# Patient Record
Sex: Male | Born: 1955 | Race: White | Hispanic: No | Marital: Married | Smoking: Never smoker
Health system: Southern US, Community
[De-identification: ages and names within clinical notes are randomized; demographics above are authoritative.]

## PROBLEM LIST (undated history)

## (undated) DIAGNOSIS — D649 Anemia, unspecified: Secondary | ICD-10-CM

## (undated) DIAGNOSIS — E669 Obesity, unspecified: Secondary | ICD-10-CM

## (undated) DIAGNOSIS — M109 Gout, unspecified: Secondary | ICD-10-CM

## (undated) DIAGNOSIS — E119 Type 2 diabetes mellitus without complications: Secondary | ICD-10-CM

## (undated) DIAGNOSIS — I10 Essential (primary) hypertension: Secondary | ICD-10-CM

## (undated) DIAGNOSIS — K760 Fatty (change of) liver, not elsewhere classified: Secondary | ICD-10-CM

## (undated) HISTORY — DX: Anemia, unspecified: D64.9

## (undated) HISTORY — DX: Obesity, unspecified: E66.9

## (undated) HISTORY — DX: Fatty (change of) liver, not elsewhere classified: K76.0

## (undated) HISTORY — DX: Essential (primary) hypertension: I10

---

## 2000-07-04 ENCOUNTER — Ambulatory Visit (HOSPITAL_COMMUNITY): Admission: RE | Admit: 2000-07-04 | Discharge: 2000-07-04 | Payer: Self-pay | Admitting: General Surgery

## 2002-09-10 ENCOUNTER — Inpatient Hospital Stay (HOSPITAL_COMMUNITY): Admission: EM | Admit: 2002-09-10 | Discharge: 2002-09-12 | Payer: Self-pay | Admitting: Emergency Medicine

## 2002-09-16 ENCOUNTER — Encounter (HOSPITAL_COMMUNITY): Admission: RE | Admit: 2002-09-16 | Discharge: 2002-10-16 | Payer: Self-pay | Admitting: Oncology

## 2002-09-16 ENCOUNTER — Encounter: Admission: RE | Admit: 2002-09-16 | Discharge: 2002-09-16 | Payer: Self-pay | Admitting: Oncology

## 2002-09-17 ENCOUNTER — Encounter: Payer: Self-pay | Admitting: Internal Medicine

## 2002-09-17 ENCOUNTER — Ambulatory Visit (HOSPITAL_COMMUNITY): Admission: RE | Admit: 2002-09-17 | Discharge: 2002-09-17 | Payer: Self-pay | Admitting: Internal Medicine

## 2002-10-06 ENCOUNTER — Ambulatory Visit (HOSPITAL_COMMUNITY): Admission: RE | Admit: 2002-10-06 | Discharge: 2002-10-06 | Payer: Self-pay | Admitting: Diagnostic Radiology

## 2002-11-25 ENCOUNTER — Encounter: Admission: RE | Admit: 2002-11-25 | Discharge: 2002-11-25 | Payer: Self-pay | Admitting: Oncology

## 2002-11-25 ENCOUNTER — Encounter (HOSPITAL_COMMUNITY): Admission: RE | Admit: 2002-11-25 | Discharge: 2002-12-24 | Payer: Self-pay | Admitting: Oncology

## 2003-05-25 ENCOUNTER — Encounter (HOSPITAL_COMMUNITY): Admission: RE | Admit: 2003-05-25 | Discharge: 2003-06-24 | Payer: Self-pay | Admitting: Oncology

## 2003-05-25 ENCOUNTER — Encounter: Admission: RE | Admit: 2003-05-25 | Discharge: 2003-05-25 | Payer: Self-pay | Admitting: Oncology

## 2004-01-26 ENCOUNTER — Encounter: Admission: RE | Admit: 2004-01-26 | Discharge: 2004-01-26 | Payer: Self-pay | Admitting: Oncology

## 2004-01-26 ENCOUNTER — Ambulatory Visit (HOSPITAL_COMMUNITY): Payer: Self-pay | Admitting: Oncology

## 2005-01-24 ENCOUNTER — Encounter: Admission: RE | Admit: 2005-01-24 | Discharge: 2005-01-24 | Payer: Self-pay | Admitting: Oncology

## 2005-01-26 ENCOUNTER — Ambulatory Visit (HOSPITAL_COMMUNITY): Payer: Self-pay | Admitting: Oncology

## 2005-03-23 ENCOUNTER — Ambulatory Visit (HOSPITAL_COMMUNITY): Admission: RE | Admit: 2005-03-23 | Discharge: 2005-03-23 | Payer: Self-pay | Admitting: Family Medicine

## 2008-03-11 ENCOUNTER — Inpatient Hospital Stay (HOSPITAL_COMMUNITY): Admission: EM | Admit: 2008-03-11 | Discharge: 2008-03-12 | Payer: Self-pay | Admitting: Emergency Medicine

## 2008-03-12 ENCOUNTER — Ambulatory Visit: Payer: Self-pay | Admitting: Internal Medicine

## 2008-03-16 ENCOUNTER — Ambulatory Visit (HOSPITAL_COMMUNITY): Admission: RE | Admit: 2008-03-16 | Discharge: 2008-03-16 | Payer: Self-pay | Admitting: Internal Medicine

## 2008-03-24 ENCOUNTER — Ambulatory Visit: Payer: Self-pay | Admitting: Internal Medicine

## 2008-03-24 ENCOUNTER — Encounter: Payer: Self-pay | Admitting: Internal Medicine

## 2008-03-24 ENCOUNTER — Ambulatory Visit (HOSPITAL_COMMUNITY): Admission: RE | Admit: 2008-03-24 | Discharge: 2008-03-24 | Payer: Self-pay | Admitting: Internal Medicine

## 2008-04-08 ENCOUNTER — Ambulatory Visit: Payer: Self-pay | Admitting: Internal Medicine

## 2008-04-08 ENCOUNTER — Encounter: Payer: Self-pay | Admitting: Urgent Care

## 2008-04-08 LAB — CONVERTED CEMR LAB
Basophils Absolute: 0.1 10*3/uL (ref 0.0–0.1)
Basophils Relative: 1 % (ref 0–1)
Eosinophils Absolute: 0.1 10*3/uL (ref 0.0–0.7)
Eosinophils Relative: 1 % (ref 0–5)
HCT: 37.9 % — ABNORMAL LOW (ref 39.0–52.0)
Hemoglobin: 10.4 g/dL — ABNORMAL LOW (ref 13.0–17.0)
Lymphocytes Relative: 10 % — ABNORMAL LOW (ref 12–46)
Lymphs Abs: 0.9 10*3/uL (ref 0.7–4.0)
MCHC: 27.4 g/dL — ABNORMAL LOW (ref 30.0–36.0)
MCV: 73.4 fL — ABNORMAL LOW (ref 78.0–100.0)
Monocytes Absolute: 0.5 10*3/uL (ref 0.1–1.0)
Monocytes Relative: 6 % (ref 3–12)
Neutro Abs: 7.2 10*3/uL (ref 1.7–7.7)
Neutrophils Relative %: 82 % — ABNORMAL HIGH (ref 43–77)
Platelets: 419 10*3/uL — ABNORMAL HIGH (ref 150–400)
RBC: 5.16 M/uL (ref 4.22–5.81)
RDW: 25.2 % — ABNORMAL HIGH (ref 11.5–15.5)
WBC: 8.7 10*3/uL (ref 4.0–10.5)

## 2008-04-26 ENCOUNTER — Ambulatory Visit: Payer: Self-pay | Admitting: Internal Medicine

## 2008-05-07 ENCOUNTER — Encounter: Payer: Self-pay | Admitting: Urgent Care

## 2008-05-13 LAB — CONVERTED CEMR LAB
Basophils Absolute: 0.1 10*3/uL (ref 0.0–0.1)
Basophils Relative: 2 % — ABNORMAL HIGH (ref 0–1)
Eosinophils Absolute: 0.1 10*3/uL (ref 0.0–0.7)
Eosinophils Relative: 2 % (ref 0–5)
HCT: 36.9 % — ABNORMAL LOW (ref 39.0–52.0)
Hemoglobin: 10.6 g/dL — ABNORMAL LOW (ref 13.0–17.0)
Lymphocytes Relative: 17 % (ref 12–46)
Lymphs Abs: 1.3 10*3/uL (ref 0.7–4.0)
MCHC: 28.7 g/dL — ABNORMAL LOW (ref 30.0–36.0)
MCV: 73.7 fL — ABNORMAL LOW (ref 78.0–100.0)
Monocytes Absolute: 0.6 10*3/uL (ref 0.1–1.0)
Monocytes Relative: 9 % (ref 3–12)
Neutro Abs: 5.2 10*3/uL (ref 1.7–7.7)
Neutrophils Relative %: 71 % (ref 43–77)
Platelets: 341 10*3/uL (ref 150–400)
RBC: 5.01 M/uL (ref 4.22–5.81)
RDW: 20 % — ABNORMAL HIGH (ref 11.5–15.5)
WBC: 7.3 10*3/uL (ref 4.0–10.5)

## 2008-05-17 ENCOUNTER — Encounter: Payer: Self-pay | Admitting: Urgent Care

## 2008-05-20 ENCOUNTER — Ambulatory Visit (HOSPITAL_COMMUNITY): Admission: RE | Admit: 2008-05-20 | Discharge: 2008-05-20 | Payer: Self-pay | Admitting: Internal Medicine

## 2008-06-01 ENCOUNTER — Telehealth (INDEPENDENT_AMBULATORY_CARE_PROVIDER_SITE_OTHER): Payer: Self-pay

## 2008-06-02 ENCOUNTER — Encounter: Payer: Self-pay | Admitting: Internal Medicine

## 2008-06-04 ENCOUNTER — Ambulatory Visit: Payer: Self-pay | Admitting: Internal Medicine

## 2008-06-04 ENCOUNTER — Telehealth: Payer: Self-pay | Admitting: Urgent Care

## 2008-06-21 ENCOUNTER — Encounter (HOSPITAL_COMMUNITY): Admission: RE | Admit: 2008-06-21 | Discharge: 2008-07-22 | Payer: Self-pay | Admitting: Oncology

## 2008-06-21 ENCOUNTER — Ambulatory Visit (HOSPITAL_COMMUNITY): Payer: Self-pay | Admitting: Oncology

## 2008-07-07 ENCOUNTER — Encounter: Payer: Self-pay | Admitting: Internal Medicine

## 2008-08-24 ENCOUNTER — Ambulatory Visit (HOSPITAL_COMMUNITY): Payer: Self-pay | Admitting: Oncology

## 2008-08-24 ENCOUNTER — Encounter (HOSPITAL_COMMUNITY): Admission: RE | Admit: 2008-08-24 | Discharge: 2008-09-23 | Payer: Self-pay | Admitting: Oncology

## 2008-08-31 ENCOUNTER — Ambulatory Visit (HOSPITAL_COMMUNITY): Admission: RE | Admit: 2008-08-31 | Discharge: 2008-08-31 | Payer: Self-pay | Admitting: Family Medicine

## 2008-11-22 ENCOUNTER — Ambulatory Visit (HOSPITAL_COMMUNITY): Payer: Self-pay | Admitting: Oncology

## 2008-11-22 ENCOUNTER — Encounter (HOSPITAL_COMMUNITY): Admission: RE | Admit: 2008-11-22 | Discharge: 2008-12-22 | Payer: Self-pay | Admitting: Oncology

## 2008-12-15 ENCOUNTER — Encounter: Payer: Self-pay | Admitting: Internal Medicine

## 2009-02-15 ENCOUNTER — Ambulatory Visit (HOSPITAL_COMMUNITY): Payer: Self-pay | Admitting: Oncology

## 2009-02-15 ENCOUNTER — Encounter (HOSPITAL_COMMUNITY): Admission: RE | Admit: 2009-02-15 | Discharge: 2009-03-17 | Payer: Self-pay | Admitting: Oncology

## 2009-04-07 ENCOUNTER — Encounter: Payer: Self-pay | Admitting: Internal Medicine

## 2009-05-13 ENCOUNTER — Ambulatory Visit (HOSPITAL_COMMUNITY): Payer: Self-pay | Admitting: Oncology

## 2009-05-13 ENCOUNTER — Encounter (HOSPITAL_COMMUNITY): Admission: RE | Admit: 2009-05-13 | Discharge: 2009-06-12 | Payer: Self-pay | Admitting: Oncology

## 2009-06-02 ENCOUNTER — Encounter: Payer: Self-pay | Admitting: Internal Medicine

## 2009-12-13 ENCOUNTER — Ambulatory Visit (HOSPITAL_COMMUNITY): Payer: Self-pay | Admitting: Oncology

## 2009-12-13 ENCOUNTER — Encounter (HOSPITAL_COMMUNITY): Admission: RE | Admit: 2009-12-13 | Discharge: 2009-12-23 | Payer: Self-pay | Admitting: Oncology

## 2009-12-27 ENCOUNTER — Encounter: Payer: Self-pay | Admitting: Internal Medicine

## 2010-01-09 ENCOUNTER — Encounter (HOSPITAL_COMMUNITY)
Admission: RE | Admit: 2010-01-09 | Discharge: 2010-02-08 | Payer: Self-pay | Source: Home / Self Care | Admitting: Oncology

## 2010-01-12 DIAGNOSIS — K2970 Gastritis, unspecified, without bleeding: Secondary | ICD-10-CM | POA: Insufficient documentation

## 2010-01-12 DIAGNOSIS — D509 Iron deficiency anemia, unspecified: Secondary | ICD-10-CM | POA: Insufficient documentation

## 2010-01-12 DIAGNOSIS — Z8719 Personal history of other diseases of the digestive system: Secondary | ICD-10-CM | POA: Insufficient documentation

## 2010-01-12 DIAGNOSIS — D126 Benign neoplasm of colon, unspecified: Secondary | ICD-10-CM | POA: Insufficient documentation

## 2010-01-12 DIAGNOSIS — K8020 Calculus of gallbladder without cholecystitis without obstruction: Secondary | ICD-10-CM | POA: Insufficient documentation

## 2010-01-12 DIAGNOSIS — K700 Alcoholic fatty liver: Secondary | ICD-10-CM | POA: Insufficient documentation

## 2010-01-16 ENCOUNTER — Encounter: Payer: Self-pay | Admitting: Gastroenterology

## 2010-01-16 ENCOUNTER — Ambulatory Visit: Payer: Self-pay | Admitting: Internal Medicine

## 2010-01-16 DIAGNOSIS — R7401 Elevation of levels of liver transaminase levels: Secondary | ICD-10-CM | POA: Insufficient documentation

## 2010-01-16 DIAGNOSIS — R740 Nonspecific elevation of levels of transaminase and lactic acid dehydrogenase [LDH]: Secondary | ICD-10-CM

## 2010-01-16 DIAGNOSIS — K7689 Other specified diseases of liver: Secondary | ICD-10-CM | POA: Insufficient documentation

## 2010-01-17 ENCOUNTER — Ambulatory Visit: Payer: Self-pay | Admitting: Internal Medicine

## 2010-01-19 ENCOUNTER — Encounter: Payer: Self-pay | Admitting: Internal Medicine

## 2010-01-20 ENCOUNTER — Encounter: Payer: Self-pay | Admitting: Internal Medicine

## 2010-01-20 ENCOUNTER — Ambulatory Visit (HOSPITAL_COMMUNITY): Admission: RE | Admit: 2010-01-20 | Discharge: 2010-01-20 | Payer: Self-pay | Admitting: Internal Medicine

## 2010-01-20 LAB — CONVERTED CEMR LAB
A-1 Antitrypsin, Ser: 136 mg/dL (ref 83–200)
Ceruloplasmin: 32 mg/dL (ref 21–63)
HCV Ab: NEGATIVE
Hepatitis B Surface Ag: NEGATIVE

## 2010-01-23 ENCOUNTER — Ambulatory Visit (HOSPITAL_COMMUNITY): Admission: RE | Admit: 2010-01-23 | Discharge: 2010-01-23 | Payer: Self-pay | Admitting: Internal Medicine

## 2010-01-27 ENCOUNTER — Telehealth: Payer: Self-pay | Admitting: Gastroenterology

## 2010-02-02 ENCOUNTER — Encounter: Payer: Self-pay | Admitting: Internal Medicine

## 2010-02-20 ENCOUNTER — Encounter: Payer: Self-pay | Admitting: Gastroenterology

## 2010-03-03 LAB — CONVERTED CEMR LAB
ALT: 76 units/L — ABNORMAL HIGH (ref 0–53)
AST: 62 units/L — ABNORMAL HIGH (ref 0–37)
Albumin: 4.2 g/dL (ref 3.5–5.2)
Alkaline Phosphatase: 47 units/L (ref 39–117)
Bilirubin, Direct: 0.1 mg/dL (ref 0.0–0.3)
Ferritin: 117 ng/mL (ref 22–322)
Indirect Bilirubin: 0.5 mg/dL (ref 0.0–0.9)
Total Bilirubin: 0.6 mg/dL (ref 0.3–1.2)
Total Protein: 7 g/dL (ref 6.0–8.3)

## 2010-03-06 ENCOUNTER — Encounter (INDEPENDENT_AMBULATORY_CARE_PROVIDER_SITE_OTHER): Payer: Self-pay

## 2010-03-13 ENCOUNTER — Encounter (HOSPITAL_COMMUNITY)
Admission: RE | Admit: 2010-03-13 | Discharge: 2010-04-12 | Payer: Self-pay | Source: Home / Self Care | Attending: Oncology | Admitting: Oncology

## 2010-03-13 ENCOUNTER — Emergency Department (HOSPITAL_COMMUNITY)
Admission: EM | Admit: 2010-03-13 | Discharge: 2010-03-13 | Payer: Self-pay | Source: Home / Self Care | Admitting: Emergency Medicine

## 2010-03-13 ENCOUNTER — Ambulatory Visit (HOSPITAL_COMMUNITY): Payer: Self-pay | Admitting: Oncology

## 2010-04-24 ENCOUNTER — Telehealth: Payer: Self-pay | Admitting: Urgent Care

## 2010-04-24 LAB — CONVERTED CEMR LAB
ALT: 57 units/L — ABNORMAL HIGH (ref 0–53)
AST: 41 units/L — ABNORMAL HIGH (ref 0–37)
Albumin: 4.2 g/dL (ref 3.5–5.2)
Alkaline Phosphatase: 49 units/L (ref 39–117)
Bilirubin, Direct: 0.1 mg/dL (ref 0.0–0.3)
Indirect Bilirubin: 0.3 mg/dL (ref 0.0–0.9)
Total Bilirubin: 0.4 mg/dL (ref 0.3–1.2)
Total Protein: 6.8 g/dL (ref 6.0–8.3)

## 2010-04-27 NOTE — Letter (Signed)
Summary: NUTRITIONAL CONSULT  NUTRITIONAL CONSULT   Imported By: Ave Filter 01/16/2010 10:50:14  _____________________________________________________________________  External Attachment:    Type:   Image     Comment:   External Document

## 2010-04-27 NOTE — Assessment & Plan Note (Signed)
Summary: abd pain/GB/wt loss/liver enzymes elevated/ss   Visit Type:  Initial Consult Referring Provider:  Dr. Dareen Piano Primary Care Provider:  Phillips Odor  Chief Complaint:  abnormal liver function tests.  History of Present Illness: [name] is a pleasant 55 y/o WM, who presents at request of Dr. Dareen Piano, for further evaluation of abnormal LFTs. See labs below.   C/O small twinges in ruq with BM but that has went away since BMs more regular. Weight down 20 pounds, intentionally. Tolerating oral iron for IDA. Never needed iron infusions. Followed by Dr. Mariel Sleet. Admits to stopping iron recently and ferritin dropped from 104 to 42 in one month.  Also quit allopurinol due to abnormal lfts. He started Milk Thistle on his own. Good appetite. Hungry all the time. Having more trouble dropping extra weight due to increased appetite since resuming iron.  No melena, brbpr. No heartburn, dysphagia, n/v.   Labs 01/06/10: WBC 6100, H/H 15.2/45.1, Plt 149,000, Cre 0.86, Tbili 0.5, AP 45, AST 68, ALT 106, alb 4, ferritin 42.  Labs 12/13/09: Plt 181,000, AST 142, ALT 188, ferritin 104.   Labs 11/25/09: AST 123, ALT 219.  Current Medications (verified): 1)  Ferrous Sulfate 325 (65 Fe) Mg Tabs (Ferrous Sulfate) .... One Tablet Every Other Day 2)  Milk Thistle .... Once Daily 3)  Cinnamon .... One Daily  Allergies (verified): No Known Drug Allergies  Past History:  Past Medical History: H/O profound IDA in 2004, Hgb 5.  He had EGD with moderate sized hh, two leiomyomas. TCS, 5mm benign colon polyp. SB givens by Dr. 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  In 2009, presented with recurrent IDA, Hgb 5, w/u included EGD/TCS. He had nodular antral folds with erosions, bx showed h.pylori (since been treated successfully given neg H.Pylori stool Ag), sb bx negative for celiac (serologies neg too), tubular adenoma removed from colon (next TCS 02/2013). Givens sb capsule  2/10-->gastric erosions, normal small bowel Gout Legally blind right eye  Past Surgical History: BENIGN CYST REMOVED FROM HIS BACK    Family History: Brother and mother both have had pancreatitis, but no h/o alcohol use. Mother, deceased at age 69, DM, CAD. No FH of CRC.   Social History: Married. Radio broadcast assistant. Denies tob use, but chews tobacco. Prior h/o heavy etoh use but none in years. One beer per month. None since 9/11.   Review of Systems General:  Denies fever, chills, sweats, anorexia, fatigue, and weakness. Eyes:  Denies vision loss. ENT:  Denies nasal congestion, sore throat, hoarseness, and difficulty swallowing. CV:  Denies chest pains, angina, palpitations, dyspnea on exertion, and peripheral edema. Resp:  Denies dyspnea at rest, dyspnea with exercise, cough, sputum, and wheezing. GI:  See HPI. GU:  Denies urinary burning and blood in urine. MS:  Denies joint pain / LOM. Derm:  Denies rash and itching. Neuro:  Denies weakness, frequent headaches, memory loss, and confusion. Psych:  Denies depression and anxiety. Endo:  Denies unusual weight change. Heme:  Denies bruising and bleeding. Allergy:  Denies hives and rash.  Vital Signs:  Patient profile:   55 year old male Height:      72 inches Weight:      243 pounds BMI:     33.08 Temp:     97.6 degrees F oral Pulse rate:   72 / minute BP sitting:   152 / 90  (left arm) Cuff size:   large  Vitals Entered By: Cloria Spring  LPN (January 16, 2010 9:34 AM)  Physical Exam  General:  Well developed, well nourished, no acute distress. Head:  Normocephalic and atraumatic. Eyes:  sclera nonicteric Mouth:  Oropharyngeal mucosa moist, pink.  No lesions, erythema or exudate.    Neck:  Supple; no masses or thyromegaly. Lungs:  Clear throughout to auscultation. Heart:  Regular rate and rhythm; no murmurs, rubs,  or bruits. Abdomen:  Bowel sounds normal.  Abdomen is soft, nontender, nondistended.  No rebound or  guarding.  No hepatosplenomegaly, masses or hernias.  No abdominal bruits.  Extremities:  No clubbing, cyanosis, edema or deformities noted. Neurologic:  Alert and  oriented x4;  grossly normal neurologically. Skin:  Intact without significant lesions or rashes. Cervical Nodes:  No significant cervical adenopathy. Psych:  Alert and cooperative. Normal mood and affect.  Impression & Recommendations:  Problem # 1:  TRANSAMINASES, SERUM, ELEVATED (ICD-790.4) Patient gives h/o intermittent elevated of AST/ALT in the past. Recently had significant increase. Remote heavy alcohol use but no significant use in years. No alcohol since 9/11. With 20 pound weight loss, he has had decrease in transaminases. H/O fatty liver on prior abd u/s in 2009 as well as cholelithiasis. Suspect abnormal lfts related to fatty liver but will recheck abd u/s and check viral markers. Repeat LFTs in four more weeks. Recommend continued slow weight loss with goal of 10 more pounds in the next 2 months. Nutrition referral for weight loss, gout, hyperglycemia. Fatty liver information provided to patient. Orders: T-Hepatitis C Antibody ([id number]) T-Hepatitis B Surface Antigen [phone]) T-Ferritin [phone]) T-Hepatic Function [phone]) T-Ceruloplasmin [phone]) T-Alpha-1-Antitrypsin Tot ([id number])  Problem # 2:  ANEMIA, IRON DEFICIENCY, CHRONIC (ICD-280.9) Recent drop in ferritin when patient came off oral iron. Complete work-up in 2004 and 2009 as outlined above. Advised to continue chronic iron therapy.  As long as he maintains his ferritin, no w/u needed at this time. He will have recheck in four weeks. Otherwise next TCS due in 02/2013.  Orders: T-Ferritin ([id number]) I would like to thank Dr. Azzie Roup for allowing [place] to take part in the care of this nice patient.  Appended Document: Orders Update    Clinical Lists Changes  Orders: Added new Service order of Consultation Level IV  ([id number]) - Signed      Appended Document: abd pain/GB/wt loss/liver enzymes elevated/ss GB u/s - reviewed w Dr. Tyron Russell; cholelithiasi - GB wall 3 mm ULN. CBD dilated; hx of elevated lfts;  some ruq described at recent ov w inc lfts; pt may need gbout; needs imaging of biliary tree further via MRCP- please schedule  Appended Document: abd pain/GB/wt loss/liver enzymes elevated/ss tried to call pt- NA  Appended Document: abd pain/GB/wt loss/liver enzymes elevated/ss Pt scheduled for MRCP 01/23/10@8 :00a.m.  Appended Document: abd pain/GB/wt loss/liver enzymes elevated/ss pt aware

## 2010-04-27 NOTE — Letter (Signed)
Summary: CLINICAL OUTPT DIETITIAN  CLINICAL OUTPT DIETITIAN   Imported By: Rexene Alberts 02/02/2010 09:32:18  _____________________________________________________________________  External Attachment:    Type:   Image     Comment:   External Document

## 2010-04-27 NOTE — Letter (Signed)
Summary: LABS FROM [facility]  LABS FROM [facility]   Imported By: Rexene Alberts 12/27/2009 13:53:23  _____________________________________________________________________  External Attachment:    Type:   Image     Comment:   External Document

## 2010-04-27 NOTE — Letter (Signed)
Summary: LABS  LABS   Imported By: Rexene Alberts 01/17/2010 09:12:44  _____________________________________________________________________  External Attachment:    Type:   Image     Comment:   External Document

## 2010-04-27 NOTE — Letter (Signed)
Summary: ABD U/S ORDER  ABD U/S ORDER   Imported By: Ave Filter 01/16/2010 11:00:52  _____________________________________________________________________  External Attachment:    Type:   Image     Comment:   External Document

## 2010-04-27 NOTE — Assessment & Plan Note (Signed)
Summary: DROPPED OFF STOOL/SS    Pt returned one ifobt and it was negative.     Allergies: No Known Drug Allergies  Other Orders: Immuno-chemical Fecal Occult ([id number])  Appended Document: DROPPED OFF STOOL/SS see labs addendum

## 2010-04-27 NOTE — Progress Notes (Signed)
Summary: f/u gallstones/mrcp  ---- Converted from flag ---- ---- 01/25/2010 10:47 AM, Jonathon Bellows MD, Caleen Essex wrote: sounds reasonable to recheck lfts and f/u w pt.  ---- 01/25/2010 8:19 AM, Leanna Battles. Lewis PA-C wrote: I really did not get a big impression of abd pain on this one. ?LFTs more related to fatty liver rather than obstruction? What do you think about repeat LFTS in 4 weeks and if improved and pt assymptomatic, then just watch OTHERWISE could send for possible cholecystectomy and liver bx.  ---- 01/23/2010 9:56 AM, Jonathon Bellows MD, Caleen Essex wrote: do you think he needs gb out? ------------------------------  Please let pt know. MRCP showed gallstones but gallbladder without evidence of acute inflammation. Discussed with Dr. Jena Gauss. Recommend the following:  1. Monitor for RUQ pain associated with meals. If develops this type of symptoms, would consider taking out gallbladder. If ever has gb surgery, ask surgeon for liver bx at same time. 2. Recheck LFTs as planned.  3. F/U OV in 6-8 weeks.  4. F/U with PCP regarding  "8 mm diameter nonshadowing echogenic focus at upper pole, question small angiomyolipoma, or less likely nonshadowing calculus" seen on recent abd u/s. Please send copy of u/s to PCP.   Appended Document: f/u gallstones/mrcp pt aware

## 2010-04-27 NOTE — Letter (Signed)
Summary: MRCP ORDER  MRCP ORDER   Imported By: Ave Filter 01/20/2010 10:53:50  _____________________________________________________________________  External Attachment:    Type:   Image     Comment:   External Document

## 2010-04-27 NOTE — Letter (Signed)
Summary: [facility]   Imported By: Diana Eves 04/07/2009 10:11:27  _____________________________________________________________________  External Attachment:    Type:   Image     Comment:   External Document

## 2010-04-27 NOTE — Miscellaneous (Signed)
Summary: Orders Update  Clinical Lists Changes  Orders: Added new Test order of T-Hepatic Function ([id number]) - Signed 

## 2010-04-27 NOTE — Letter (Signed)
Summary: [facility]   Imported By: Diana Eves 06/02/2009 14:52:06  _____________________________________________________________________  External Attachment:    Type:   Image     Comment:   External Document

## 2010-04-27 NOTE — Letter (Signed)
Summary: [facility]  [facility]   Imported By: Rexene Alberts 01/19/2010 12:02:14  _____________________________________________________________________  External Attachment:    Type:   Image     Comment:   External Document

## 2010-04-27 NOTE — Letter (Signed)
Summary: Recall, Labs Needed  [facility]  [address]   [place], [place] [address]   Phone: [phone]  Fax: [phone]    March 06, 2010  Calvin Green [address] [place], [place]  [address]  [place] [birth year]   Dear [name],   Our records indicate it is time to repeat your blood work.  You can take the enclosed form to the lab on or near the date indicated.  Please make note of the new location of the lab:   [address], [address]nd floor   [facility]  Our office will call you within a week to ten business days with the results.  If you do not hear from [place] in 10 business days, you should call the office.  If you have any questions regarding this, call the office at [phone], and ask for the nurse.  Labs are due on 04/17/2010.   Sincerely,    Hendricks Limes LPN  [facility] Ph: [phone]   Fax: [phone]

## 2010-05-03 NOTE — Progress Notes (Signed)
Summary: Pt prefers to see DR Jena Gauss ONLY  ---- Converted from flag ---- ---- 04/24/2010 10:55 AM, Joselyn Arrow FNP-BC wrote:   ---- 04/21/2010 5:21 PM, Jonathon Bellows MD, Caleen Essex wrote: He has 3 options; 1.  see extender and pay what everyboduy else pays;  2.  Wait  to see me April / May or whenever;   3.   or go somewhere else.  If he wants to speak to someone further up the line, I'd give him Scott jobe's contact info ------------------------------

## 2010-06-05 LAB — CBC
HCT: 47.2 % (ref 39.0–52.0)
Hemoglobin: 16.3 g/dL (ref 13.0–17.0)
MCH: 28.5 pg (ref 26.0–34.0)
MCHC: 34.5 g/dL (ref 30.0–36.0)
MCV: 82.7 fL (ref 78.0–100.0)
Platelets: 178 10*3/uL (ref 150–400)
RBC: 5.71 MIL/uL (ref 4.22–5.81)
RDW: 13.6 % (ref 11.5–15.5)
WBC: 6.9 10*3/uL (ref 4.0–10.5)

## 2010-06-05 LAB — COMPREHENSIVE METABOLIC PANEL
ALT: 57 U/L — ABNORMAL HIGH (ref 0–53)
AST: 43 U/L — ABNORMAL HIGH (ref 0–37)
Albumin: 3.9 g/dL (ref 3.5–5.2)
Alkaline Phosphatase: 42 U/L (ref 39–117)
BUN: 12 mg/dL (ref 6–23)
CO2: 29 mEq/L (ref 19–32)
Calcium: 9.2 mg/dL (ref 8.4–10.5)
Chloride: 101 mEq/L (ref 96–112)
Creatinine, Ser: 1 mg/dL (ref 0.4–1.5)
GFR calc Af Amer: >60 mL/min (ref >60)
GFR calc non Af Amer: >60 mL/min (ref >60)
Glucose, Bld: 117 mg/dL — ABNORMAL HIGH (ref 70–99)
Potassium: 4.5 mEq/L (ref 3.5–5.1)
Sodium: 138 mEq/L (ref 135–145)
Total Bilirubin: 0.6 mg/dL (ref 0.3–1.2)
Total Protein: 6.4 g/dL (ref 6.0–8.3)

## 2010-06-05 LAB — DIFFERENTIAL
Basophils Absolute: 0.1 10*3/uL (ref 0.0–0.1)
Basophils Relative: 1 % (ref 0–1)
Eosinophils Absolute: 0.2 10*3/uL (ref 0.0–0.7)
Eosinophils Relative: 2 % (ref 0–5)
Lymphocytes Relative: 16 % (ref 12–46)
Lymphs Abs: 1.1 10*3/uL (ref 0.7–4.0)
Monocytes Absolute: 0.6 10*3/uL (ref 0.1–1.0)
Monocytes Relative: 9 % (ref 3–12)
Neutro Abs: 5 10*3/uL (ref 1.7–7.7)
Neutrophils Relative %: 73 % (ref 43–77)

## 2010-06-05 LAB — FERRITIN: Ferritin: 70 ng/mL (ref 22–322)

## 2010-06-07 LAB — COMPREHENSIVE METABOLIC PANEL
ALT: 106 U/L — ABNORMAL HIGH (ref 0–53)
AST: 68 U/L — ABNORMAL HIGH (ref 0–37)
Albumin: 4 g/dL (ref 3.5–5.2)
Alkaline Phosphatase: 45 U/L (ref 39–117)
BUN: 10 mg/dL (ref 6–23)
CO2: 27 mEq/L (ref 19–32)
Calcium: 9.5 mg/dL (ref 8.4–10.5)
Chloride: 105 mEq/L (ref 96–112)
Creatinine, Ser: 0.86 mg/dL (ref 0.4–1.5)
GFR calc Af Amer: >60 mL/min (ref >60)
GFR calc non Af Amer: >60 mL/min (ref >60)
Glucose, Bld: 131 mg/dL — ABNORMAL HIGH (ref 70–99)
Potassium: 4.3 mEq/L (ref 3.5–5.1)
Sodium: 139 mEq/L (ref 135–145)
Total Bilirubin: 0.5 mg/dL (ref 0.3–1.2)
Total Protein: 6.4 g/dL (ref 6.0–8.3)

## 2010-06-07 LAB — CBC
HCT: 45.1 % (ref 39.0–52.0)
Hemoglobin: 15.2 g/dL (ref 13.0–17.0)
MCH: 28.2 pg (ref 26.0–34.0)
MCHC: 33.8 g/dL (ref 30.0–36.0)
MCV: 83.4 fL (ref 78.0–100.0)
Platelets: 149 10*3/uL — ABNORMAL LOW (ref 150–400)
RBC: 5.41 MIL/uL (ref 4.22–5.81)
RDW: 14.6 % (ref 11.5–15.5)
WBC: 6.1 10*3/uL (ref 4.0–10.5)

## 2010-06-07 LAB — DIFFERENTIAL
Basophils Absolute: 0.1 10*3/uL (ref 0.0–0.1)
Basophils Relative: 1 % (ref 0–1)
Eosinophils Absolute: 0.1 10*3/uL (ref 0.0–0.7)
Eosinophils Relative: 2 % (ref 0–5)
Lymphocytes Relative: 18 % (ref 12–46)
Lymphs Abs: 1.1 10*3/uL (ref 0.7–4.0)
Monocytes Absolute: 0.4 10*3/uL (ref 0.1–1.0)
Monocytes Relative: 7 % (ref 3–12)
Neutro Abs: 4.4 10*3/uL (ref 1.7–7.7)
Neutrophils Relative %: 72 % (ref 43–77)

## 2010-06-07 LAB — FERRITIN: Ferritin: 42 ng/mL (ref 22–322)

## 2010-06-08 LAB — DIFFERENTIAL
Basophils Absolute: 0 10*3/uL (ref 0.0–0.1)
Basophils Relative: 1 % (ref 0–1)
Eosinophils Absolute: 0.1 10*3/uL (ref 0.0–0.7)
Eosinophils Relative: 2 % (ref 0–5)
Lymphocytes Relative: 18 % (ref 12–46)
Lymphs Abs: 0.8 10*3/uL (ref 0.7–4.0)
Monocytes Absolute: 0.3 10*3/uL (ref 0.1–1.0)
Monocytes Relative: 7 % (ref 3–12)
Neutro Abs: 3.2 10*3/uL (ref 1.7–7.7)
Neutrophils Relative %: 72 % (ref 43–77)

## 2010-06-08 LAB — CBC
HCT: 48.3 % (ref 39.0–52.0)
Hemoglobin: 15.9 g/dL (ref 13.0–17.0)
MCH: 27.7 pg (ref 26.0–34.0)
MCHC: 33 g/dL (ref 30.0–36.0)
MCV: 84 fL (ref 78.0–100.0)
Platelets: 181 10*3/uL (ref 150–400)
RBC: 5.75 MIL/uL (ref 4.22–5.81)
RDW: 15.3 % (ref 11.5–15.5)
WBC: 4.5 10*3/uL (ref 4.0–10.5)

## 2010-06-08 LAB — COMPREHENSIVE METABOLIC PANEL
ALT: 188 U/L — ABNORMAL HIGH (ref 0–53)
AST: 142 U/L — ABNORMAL HIGH (ref 0–37)
Albumin: 4.2 g/dL (ref 3.5–5.2)
Alkaline Phosphatase: 47 U/L (ref 39–117)
BUN: 11 mg/dL (ref 6–23)
CO2: 25 mEq/L (ref 19–32)
Calcium: 9.5 mg/dL (ref 8.4–10.5)
Chloride: 105 mEq/L (ref 96–112)
Creatinine, Ser: 1.04 mg/dL (ref 0.4–1.5)
GFR calc Af Amer: >60 mL/min (ref >60)
GFR calc non Af Amer: >60 mL/min (ref >60)
Glucose, Bld: 138 mg/dL — ABNORMAL HIGH (ref 70–99)
Potassium: 4.4 mEq/L (ref 3.5–5.1)
Sodium: 139 mEq/L (ref 135–145)
Total Bilirubin: 0.9 mg/dL (ref 0.3–1.2)
Total Protein: 7 g/dL (ref 6.0–8.3)

## 2010-06-08 LAB — FERRITIN: Ferritin: 104 ng/mL (ref 22–322)

## 2010-06-14 LAB — DIFFERENTIAL
Basophils Absolute: 0 10*3/uL (ref 0.0–0.1)
Basophils Relative: 1 % (ref 0–1)
Eosinophils Absolute: 0.1 10*3/uL (ref 0.0–0.7)
Eosinophils Relative: 1 % (ref 0–5)
Lymphocytes Relative: 15 % (ref 12–46)
Lymphs Abs: 1.1 10*3/uL (ref 0.7–4.0)
Monocytes Absolute: 0.4 10*3/uL (ref 0.1–1.0)
Monocytes Relative: 6 % (ref 3–12)
Neutro Abs: 6 10*3/uL (ref 1.7–7.7)
Neutrophils Relative %: 78 % — ABNORMAL HIGH (ref 43–77)

## 2010-06-14 LAB — CBC
HCT: 45.1 % (ref 39.0–52.0)
Hemoglobin: 15.4 g/dL (ref 13.0–17.0)
MCHC: 34.2 g/dL (ref 30.0–36.0)
MCV: 83.7 fL (ref 78.0–100.0)
Platelets: 189 10*3/uL (ref 150–400)
RBC: 5.39 MIL/uL (ref 4.22–5.81)
RDW: 15.3 % (ref 11.5–15.5)
WBC: 7.6 10*3/uL (ref 4.0–10.5)

## 2010-06-14 LAB — FERRITIN: Ferritin: 43 ng/mL (ref 22–322)

## 2010-06-28 LAB — CBC
HCT: 43.8 % (ref 39.0–52.0)
Hemoglobin: 14.2 g/dL (ref 13.0–17.0)
MCHC: 32.4 g/dL (ref 30.0–36.0)
MCV: 79.7 fL (ref 78.0–100.0)
Platelets: 200 10*3/uL (ref 150–400)
RBC: 5.5 MIL/uL (ref 4.22–5.81)
RDW: 21.6 % — ABNORMAL HIGH (ref 11.5–15.5)
WBC: 7.1 10*3/uL (ref 4.0–10.5)

## 2010-07-01 LAB — CBC
MCHC: 31.5 g/dL (ref 30.0–36.0)
MCV: 63.9 fL — ABNORMAL LOW (ref 78.0–100.0)
Platelets: 258 10*3/uL (ref 150–400)
RBC: 5.07 MIL/uL (ref 4.22–5.81)
RDW: 19.7 % — ABNORMAL HIGH (ref 11.5–15.5)

## 2010-07-01 LAB — FERRITIN: Ferritin: 8 ng/mL — ABNORMAL LOW (ref 22–322)

## 2010-07-03 LAB — CBC
Platelets: 303 10*3/uL (ref 150–400)
RDW: 20.6 % — ABNORMAL HIGH (ref 11.5–15.5)

## 2010-07-06 LAB — FERRITIN: Ferritin: 5 ng/mL — ABNORMAL LOW (ref 22–322)

## 2010-07-06 LAB — IRON AND TIBC
Iron: 17 ug/dL — ABNORMAL LOW (ref 42–135)
TIBC: 450 ug/dL — ABNORMAL HIGH (ref 215–435)
UIBC: 433 ug/dL

## 2010-07-06 LAB — CBC
RBC: 5.45 MIL/uL (ref 4.22–5.81)
WBC: 6.8 10*3/uL (ref 4.0–10.5)

## 2010-07-10 ENCOUNTER — Encounter (HOSPITAL_COMMUNITY): Payer: BC Managed Care – PPO | Attending: Oncology | Admitting: Oncology

## 2010-07-10 DIAGNOSIS — D509 Iron deficiency anemia, unspecified: Secondary | ICD-10-CM | POA: Insufficient documentation

## 2010-08-08 NOTE — Op Note (Signed)
NAME:  Calvin [name], Calvin [name]                ACCOUNT NO.:  [id number]   MEDICAL RECORD NO.:  [id number]          PATIENT TYPE:  AMB   LOCATION:  DAY                           FACILITY:  [facility]   PHYSICIAN:  R. Roetta Sessions, M.D. DATE OF BIRTH:  [birth year]   DATE OF PROCEDURE:  03/24/2008  DATE OF DISCHARGE:                               OPERATIVE REPORT   PROCEDURE:  Colonoscopy with snare polypectomy followed by  esophagogastroduodenoscopy with biopsy, small bowel and stomach.   INDICATIONS FOR PROCEDURE:  A 52-year gentleman with profound microcytic  anemia without obvious gastrointestinal bleeding, now undergoing  colonoscopy with possible EGD to follow.  Risks, benefits, alternatives,  and limitations have been reviewed, questions answered.  He has had an  outpatient ultrasound which demonstrated a fatty liver and tiny  gallstones within the gallbladder.  This approach has been discussed  with Mr. Lowdermilk at length.  Risks, benefits, and alternatives have  been reviewed, questions answered and agreeable.  Please see the  documentation of the medical record.   PROCEDURE NOTE:  O2 saturation, blood pressure, pulses, and respirations  were monitored throughout the entire procedure.   CONSCIOUS SEDATION:  Versed 6 mg IV, Demerol 125 mg IV in divided doses.  Cetacaine spray for topical pharyngeal anesthesia.   INSTRUMENTATION:  Pentax video chip system.   FINDINGS:  Colonoscopy and digital rectal exam revealed no  abnormalities.   ENDOSCOPIC FINDINGS:  The prep was adequate.  Colon:  Colonic mucosa was  surveyed from the rectosigmoid junction through the left transverse  right colon to the appendiceal orifice, ileocecal valve, and cecum.  These structures were well seen and photographed for the record.  From  this level, the scope was slowly and cautiously withdrawn.  All  previously-mentioned mucosal surfaces were again seen.  The patient had  left-sided diverticula.  There was a 5-mm  polyp on a stalk and the mid  descending colon was cold snared and recovered with the scope.  Remainder of the colonic mucosa appeared normal.  Scope was pulled down  the rectum.  A thorough examination of the rectal mucosa including  retroflexed view of the anal verge demonstrated no abnormalities.  The  patient was consequently prepared for EGD as planned.  Examination of  the tubular esophagus revealed a thickened but noncritical muscular ring  in the EG junction.  Esophageal mucosa otherwise appeared normal.  EG  junction easily traversed.  Stomach:  Gas cavity was emptied and  insufflated well with air.  A thorough examination of the gastric mucosa  including retroflexed proximal stomach, esophagogastric junction  demonstrated a moderate-sized hiatal hernia and a couple of beefy antral  prepyloric folds with overlying superficial erosions.  Please see  photos.  There was no ulcer infiltrating process.  Pylorus patent,  easily traversed, and the bulb and second portion and third portion  demonstrated a subtle geographic changes of the mucosa.  There was no  ulcer erosion.  The mucosa did not have a scalp appearance.   THERAPEUTIC/DIAGNOSTIC MANEUVERS PERFORMED:  Biopsies of D2 and D3 were  taken for histologic study  and biopsies of the notch of the nodular  folds were taken in the antrum separately.   The patient tolerated both procedures well.  It was reactive endoscopy.   IMPRESSION:  1. Colonoscopy:  Normal rectum, left-sided diverticula, polyp mid      descending colon status post snare polypectomy.  Remainder of the      colonic mucosa appeared normal.  2. Esophagogastroduodenoscopy Findings:  A thick muscular but      noncritical Schatzki ring not manipulated, otherwise unremarkable      esophagus.  Moderate-sized hiatal hernia, nodular antral folds with      overlying superficial erosion of uncertain significance status post      biopsy, patent pylorus, some subtle  geographic changes of the D2,      D3 mucosa status post biopsy.  Recommendations will follow up on      path.  3. We will arrange a followup on with [place] in the office in 2-3 weeks.      Jonathon Bellows, M.D.  Electronically Signed     RMR/MEDQ  D:  03/24/2008  T:  03/24/2008  Job:  [id number]   cc:   Patrica Duel, M.D.  Fax: [phone]

## 2010-08-08 NOTE — H&P (Signed)
Calvin [name], Calvin [name]                ACCOUNT NO.:  [id number]   MEDICAL RECORD NO.:  [id number]          PATIENT TYPE:  INP   LOCATION:  A335                          FACILITY:  [facility]   PHYSICIAN:  Lonia Blood, M.D.      DATE OF BIRTH:  [birth year]   DATE OF ADMISSION:  03/11/2008  DATE OF DISCHARGE:  LH                              HISTORY & PHYSICAL   PRIMARY CARE PHYSICIAN:  Dr. Nobie [name].   PRESENTING COMPLAINT:  Severe anemia.   HISTORY OF PRESENT ILLNESS:  The patient is a 55 year old gentleman with  prior history of anemia four years ago.  During that time, he was worked  up and had a small bowel AVM.  The patient went to Dr. Geanie [name] office  and had blood work done.  Blood work then showed hemoglobin of 5.4.  He  was subsequently sent to the emergency room for further treatment.  He  denied any symptoms at this point.  Wife has noted that he was  jaundiced.  The patient was found to have a hemoglobin of 5.3 in the  emergency room, hence he is being admitted for further management.   PAST MEDICAL HISTORY:  Significant for previous anemia.  The patient is  blind in the right eye.  History of gout.   ALLERGIES:  NO KNOWN DRUG ALLERGIES.   MEDICATIONS:  Occasional iron.   SOCIAL HISTORY:  The patient is married, lives here in [place].  He  smokes half to one pack per day.  Occasional alcohol, but denied any IV  drug use.   FAMILY HISTORY:  Denied any significant family history for bleed or  cancer.   REVIEW OF SYSTEMS:  A 14-point review of systems is negative except per  HPI.   PHYSICAL EXAMINATION:  VITAL SIGNS:  Temperature is 98.8, blood pressure  127/81, pulse 101, respiratory rate 18.  His sats are 100% on room air.  GENERAL:  The patient is a pleasant man in no acute distress.  HEENT:  PERRL.  EOMI.  NECK:  Supple.  No JVD, no lymphadenopathy.  RESPIRATORY:  He has good air entry bilaterally.  No wheezes or rales.  CARDIOVASCULAR:  S1-S2, no murmur.  ABDOMEN:   Soft, nontender with positive bowel sounds.  EXTREMITIES:  No edema, cyanosis or clubbing.  SKIN:  Showed pale skin versus palmar erythema.  His mucous membranes  are also pale.   LABORATORY DATA:  Showed sodium 135, potassium 4.1, chloride 106, CO2 of  26, glucose 207, BUN 11, creatinine 0.98, calcium 8.8, total protein  6.5.  Mild elevation of AST at 53.  Albumin 3.6.  White count is 6.4,  hemoglobin 5.2 and platelet count 452.  Lipase is 39, retic count 2.9  with an absolute of 97.4.  Urinalysis is essentially negative.   ASSESSMENT:  Therefore, this is a 54 year old gentleman presenting with  severe anemia which appeared to be chronic in nature.  The patient's  guaiac in the emergency department has been fully attempted as is  equivocal at the most.  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  This may  be with oozing blood slowly.  At this point, however, because it is  chronic, that is why the patient has no severe symptoms despite having a  hemoglobin of 5.   PLAN:  1. Severe anemia.  Will admit the patient.  Will give up to 4 units of      packed red blood cells.  Follow his hemoglobin and hematocrit.  Get      GI consult for possible EGD, colonoscopy or repeat capsule      endoscopy.  2. Hyperglycemia.  We will check hemoglobin A1c to see if the patient      has some diabetes or at least glucose intolerance.  3. Thrombocytosis.  Again, platelet count is high.  We will follow the      numbers.  Maybe this is related to his severe anemia.  4. History of gout.  The patient has no complaint at the moment.  I      will not give any treatment for now.  5.  Right eye blindness.      Again, this is  chronic, but the patient is able to cope.  Further      treatment will depend on how the patient does in the hospital.      Lonia Blood, M.D.  Electronically Signed     LG/MEDQ  D:  03/12/2008  T:  03/12/2008   Job:  [id number]

## 2010-08-08 NOTE — Op Note (Signed)
NAME:  Calvin [name], Calvin [name]                ACCOUNT NO.:  [id number]   MEDICAL RECORD NO.:  [id number]          PATIENT TYPE:  AMB   LOCATION:  DAY                           FACILITY:  [facility]   PHYSICIAN:  R. Roetta Sessions, M.D. DATE OF BIRTH:  1955/12/10   DATE OF PROCEDURE:  05/20/2008  DATE OF DISCHARGE:  05/20/2008                                PROCEDURE NOTE   PROCEDURE:  Small bowel given Given's capsules study.   INDICATIONS FOR PROCEDURE:  [name] is a 55 year old Caucasian male  with history of chronic iron deficiency anemia.  He also had history a  positive H. pylori status post successful treatment confirmed by H.  pylori stool antigen.  He did have colonoscopy and EGD with small bowel  biopsy by Dr. Jena Gauss on March 24, 2008.  There was nothing to explain  his iron deficiency anemia.  He did have left-sided diverticula and a  Schatzki ring on EGD. He had tubular adenoma removed from his descending  colon.  He had a benign small bowel biopsy and a gastric biopsy showed  chronic active gastritis with H. pylori.  He denies any gross bleeding,  but he is Hemoccult positive.  He is undergoing small bowel Given's  capsule study to help determine if there is any cause for his iron  deficiency anemia/GI bleeding.   FINDINGS:  First gastric images at 2 seconds.  There was some gastric  erosions incidentally noted on the study without any active bleeding.  The gastric transit time was 1 hour 56 minutes.  First cecal image was  at 5 hours 56 minutes and 14 seconds.  This was a normal small bowel  Given's capsule study.  First duodenal image shows that 1 hour 56  minutes and 49 seconds; at 4 hours, 5 minutes and 23 seconds to 4 hours  5 minutes and 49 seconds.  There is a loss of reception with the capsule  and a whiteout.  Otherwise, visualized portions of the small bowel  were entirely normal.  There was no evidence of mass, stricture, AVMs,  or active bleeding.  Small bowel transit  time was 3 hours 59 minutes.   RECOMMENDATIONS:  I did review study including whiteout images with Dr.  Jonathon Bellows.  Per his request, [name] will be referred to Dr.  Mariel Sleet, hematologist, for further evaluation of iron deficiency  anemia and to consider parenteral iron replacement.      Lorenza Burton, N.P.      Jonathon Bellows, M.D.  Electronically Signed    KJ/MEDQ  D:  06/04/2008  T:  06/04/2008  Job:  [id number]   cc:   Patrica Duel, M.D.  Fax: [phone]   Ladona Horns. Mariel Sleet, MD  Fax: [phone]

## 2010-08-08 NOTE — Consult Note (Signed)
[name], [name]                ACCOUNT NO.:  [id number]   MEDICAL RECORD NO.:  [id number]          PATIENT TYPE:  INP   LOCATION:  A335                          FACILITY:  [facility]   PHYSICIAN:  Calvin [name], M.D. DATE OF BIRTH:  [birth year]   DATE OF CONSULTATION:  03/12/2008  DATE OF DISCHARGE:                                 CONSULTATION   REASON FOR CONSULTATION:  GI bleeding.   REQUESTING PHYSICIAN:  Calvin [name], M.D.   HISTORY OF PRESENT ILLNESS:  The patient is a 55 year old gentleman who  was advised to go to the emergency department yesterday for admission  for profound microcytic anemia.  He went to see his PCP at [name] because his wife stated that he looked yellow.  Lab  work actually revealed normal total bilirubin, but his hemoglobin was 5.  They were contacted yesterday and advised to go to the emergency  department for admission for transfusion.  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  He  presented with hemoglobin in the 5-6 range back in 2004.  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  Biopsy is benign.  Left-sided  diverticulum in the colon and the terminal ilium looked normal.  He did  have a 5 mm polyp in the colon which was inflammatory.  He subsequently  underwent a Given's small bowel capsule endoscopy by Dr. Stan [name] in  [place].  This revealed 4 sites that appeared to be consistent with  AVMs, 1 was in the jejunum and 3 in the ileum.  The patient followed up  with Dr. Mariel [name] for a couple of years regarding iron-deficiency  anemia and had done well.  Subsequently, he decided not to go back to  see him.  He had been on iron supplement since that time, but about 6  months ago, started having increasing difficulty with GI symptoms and  basically takes his iron about 50% of the time.   He  denies any [name] in the stool or melena.  He has occasional  postprandial loose explosive stools with some mild right upper quadrant  pain.  He wonders if his gallbladder is going bad.  He has occasional  heartburn.  He denies dysphagia, odynophagia, nausea, vomiting or weight  loss.   MEDICATIONS:  1. He is supposed to be on iron daily, but noncompliant due to GI      upset as outlined above.  2. Denies NSAIDs or aspirin use.   ALLERGIES:  NO KNOWN DRUG ALLERGIES.   PAST MEDICAL HISTORY:  1. History of iron-deficiency anemia as above.  2. History of gout.  3. 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  4. He has had a cyst removed from his back.  5. Prior colonoscopy and EGD as above.   FAMILY HISTORY:  Brother and mother both have had pancreatitis, but no  history of alcohol use.  Patient is not aware of the etiology  of the  pancreatitis.  Mother also deceased at age 45, had diabetes mellitus and  CAD.  No family history of colorectal cancer.   SOCIAL HISTORY:  He is married.  He is an Radio broadcast assistant.  He  denies any tobacco use, but does chew tobacco.  He occasionally drinks  mixed drinks.  The last one was 2 weeks ago.  He has a prior history of  heavy use.   REVIEW OF SYSTEMS:  See HPI for GI and constitutional.  CARDIOPULMONARY:  Denies chest pain, shortness of breath, palpitations or cough.  GENITOURINARY:  No dysuria or hematuria.   PHYSICAL EXAMINATION:  VITAL SIGNS:  Temperature 98.4, pulse 70,  respirations 20, [name] pressure 110/58, height 71 inches, weight 113.4  kg.  GENERAL:  Pleasant, obese Caucasian gentleman in no acute distress.  SKIN:  Warm and dry.  No jaundice.  HEENT:  Sclerae are nonicteric.  Oropharyngeal mucosa moist and pink.  No lesions, erythema or exudate.  No lymphadenopathy, thyromegaly.  CHEST:  Lungs are clear to auscultation.  CARDIAC:  Reveals regular rate and rhythm.  Normal S1 and S2.  No  murmurs, rubs or gallops.  ABDOMEN:  Obese.   Positive bowel sounds.  Soft, nontender.  No  organomegaly or masses.  No rebound or guarding.  No abdominal bruits or  hernias.  LOWER EXTREMITIES:  No edema.  RECTAL:  Digital rectal exam, no masses externally.  Normal internal  rectal exam.  No masses.  Small amount of brown stool sent to the lab  for Hemoccult, but results are pending.   LABORATORY DATA:  Hemoglobin 4.5 prior to his transfusion.  He got 2  units of packed red [name] cells and hemoglobin this morning is 6.4.  White count 5800, platelets 273,000, MCV 64.3 as an outpatient.  Urinalysis negative.  Lipase 38, sodium 135, potassium 4.1, BUN 11,  creatinine 0.98, glucose 208, total bilirubin 0.8, alkaline phosphatase  48, AST 53, ALT 31, albumin 3.6, INR 1.1.   IMPRESSION:  Patient is a 55 year old gentleman with profound microcytic  anemia without evidence of overt gastrointestinal bleeding.  His  Hemoccults are pending.  Suspect he has iron-deficiency anemia likely  secondary to chronic occult GI bleeding due to AVMs.  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  He has mildly elevated AST with last alcohol  consumption over 2 weeks ago.  He denies any further ongoing heavy  alcohol use, but does have a history of it in the remote past.  He also  admittedly has gained 50 pounds in the last year.  He may have  underlying fatty infiltration of the liver.  He has some postprandial  explosive loose stools and mild right upper quadrant pain.  Trying to  exclude the possibility of biliary etiology.   RECOMMENDATIONS:  1. He was given a Niferex prescription yesterday.  Would advise him to      go ahead and fill that and take 1 daily.  His wife asked that I      also give him another brand in case he did not tolerate it and I      wrote for [employer] to take 1 daily, number 30 with 5 refills.  2. We will go ahead and add celiac panel with IgA level.  3. We will  transfuse 2 more units of packed red [name] cells today.  If      his hemoglobin is  close to 8 range after transfusion, it would be      okay from a GI standpoint for discharge to home.  We will recheck      his hemoglobin and hematocrit along with LFTs on Monday.  4. I have arranged for outpatient colonoscopy with possible EGD.  I      offered next week with Dr. Cira Servant, but he would prefer to follow up      with Dr. Jena Gauss as before.  He is scheduled for March 24, 2008.  5. Outpatient abdominal ultrasound to be scheduled.  6. We will follow up on anemia panel and Hemoccult.   I would like to thank Dr. Mikeal Hawthorne for allowing [place] to take part in the care  of this patient.      Tana Coast, P.AJonathon Bellows, M.D.  Electronically Signed    LL/MEDQ  D:  03/12/2008  T:  03/12/2008  Job:  [id number]   cc:   Cal[facility] [name], M.D.   [facility]   Calvin [name], M.D.  [address]  [place]  [place] [address]

## 2010-08-08 NOTE — Assessment & Plan Note (Signed)
NAMEMarland Kitchen  WILLOW, RECZEK                 CHART#:  [id number]   DATE:  04/08/2008                       DOB:  05/20/1955   PRIMARY CARE PHYSICIAN:  Patrica Duel, MD   CHIEF COMPLAINT:  Followup hospitalization/anemia.   PROBLEM LIST:  1. Chronic iron deficiency anemia.  Workup has been extensive and      initiated back in 2004 with most recent colonoscopy March 24, 2008, by Dr. Jena Gauss which showed left-sided diverticula.  A tubular      adenoma removed from the descending colon.  Benign small bowel      biopsy on EGD.  A thick muscular noncritical Schatzki ring was not      manipulated.  He had a moderate size hiatal hernia.  Nodular antral      folds showing chronic active gastritis with H. pylori in the      stomach.  2. Given capsule study which showed small bowel AVMs, one in the      jejunum and 3 in ileum in July 2004.  3. Cholelithiasis seen on ultrasound March 16, 2008, seem to be      asymptomatic.  4. Diffuse fatty liver on ultrasound March 16, 2008, with normal      LFTs.  5. 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  6. Gout.  7. Right eye blindness.  8. Benign cyst removed from his back.  9. History of heavy alcohol use.   SUBJECTIVE:  The patient is a 55 year old Caucasian male, who was  recently hospitalized with profound anemia, which did require  transfusion.  He was found to have H. pylori.  He has a chronic iron  deficiency anemia.  He has had some recent episodes of diarrhea a couple  of days ago.  He is taking Prevpac, he has been on this for 7 days.  His  diarrhea was short lived and only lasted 1 day.  He is taking iron.  He  seems to be tolerating it well.  His last hemoglobin was from March 24, 2008, was 10.3.  He overall feels well.  He denies any abdominal  pain, nausea, vomiting, rectal bleeding, or melena.  He has had some  mild fatigue.   CURRENT MEDICATIONS:  Prevpac and Niferex  daily.   ALLERGIES:  No known drug allergies.   OBJECTIVE:  VITAL SIGNS:  Weight 246 pounds, height 72 inches,  temperature 97.8, blood pressure 140/92, and pulse 64.  GENERAL:  He is an obese Caucasian male, who is alert, oriented,  pleasant, and cooperative, no acute distress.  HEENT:  Sclerae clear, nonicteric.  Conjunctivae pink.  Oropharynx pink  and moist without any lesions.  HEART:  Regular rate and rhythm.  Normal S1 and S2.  ABDOMEN:  Protuberant with positive bowel sounds x4.  No bruits  auscultated.  Soft, nontender, nondistended without palpable mass or  hepatosplenomegaly.  No rebound, tenderness, or guarding.  EXTREMITIES:  With clubbing, but no edema.   ASSESSMENT:  The patient is a 55 year old Caucasian male with chronic  iron deficiency anemia, which I suspect is due to a combination of  Helicobacter pylori gastritis as well as possible small bowel  arteriovenous malformations.   He has history of heavy  alcohol use.   He has fatty liver on ultrasound with history of normal LFTs.   He has history of adenomatous colon polyp.   PLAN:  1. Low-fat, low-cholesterol diet with the goal weight loss of 1-2      pounds per week.   1. Colonoscopy December 2014.  2. Recheck CBC.  3. Complete Prevpac.  4. Continue iron.  5. If hemoglobin continues to remain unstable, we will consider      Hemoccult stools x3 with possibly repeating Given capsule study to      determine if he would benefit from small bowel enteroscopy      intervention.  6. His wife did have multiple questions about whether we would be      rechecking his H. pylori status.  I did explain to her that we do      not routinely check this.  However, if he is unable to maintain his      hemoglobin or has continued problems, we may need to repeat a stool      antigen in a couple of months down the road.       Lorenza Burton, N.P.  Electronically Signed     R. Roetta Sessions, M.D.  Electronically  Signed    KJ/MEDQ  D:  04/09/2008  T:  04/10/2008  Job:  [id number]   cc:   Patrica Duel, M.D.

## 2010-08-11 NOTE — H&P (Signed)
   NAME:  Calvin Green, Calvin Green NO.:  [id number]   MEDICAL RECORD NO.:  [id number]                   PATIENT TYPE:  INP   LOCATION:  A211                                 FACILITY:  [facility]   PHYSICIAN:  Corrie Mckusick, M.D.               DATE OF BIRTH:  07-20-1955   DATE OF ADMISSION:  09/10/2002  DATE OF DISCHARGE:                                HISTORY & PHYSICAL   PRIMARY PHYSICIAN:  Corrie Mckusick, M.D.   ADMISSION DIAGNOSIS:  Anemia.   HISTORY OF PRESENTING ILLNESS:  A 55 year old gentleman who presents at the  office quite pale and fatigued.  He had an anemia 2 years ago.  He was seen  by Dr. Nobie Putnam which was really undiagnosed etiology.  He had hemoglobin,  at that time, of 9.  He was placed on iron with improvement of the anemia.  At that time colonoscopy done and was negative.  EGD was not performed at  that time due to a lack of symptoms.   He now presents again with similar fatigue, but somewhat worse.  He reports  no GI bleeds at all.  Reports no melena, reports no bright red blood per  rectum, reports no abdominal pain.  In the office a rectal was done which  showed a normal appearing stool color, but it was guaiac-positive.   PAST MEDICAL HISTORY:  None.   PAST SURGICAL HISTORY:  None.   MEDICATIONS:  None.   ALLERGIES:  No known drug allergies.   PHYSICAL EXAMINATION:  VITAL SIGNS:  When I saw the patient temp was 98.1,  pulse 72, blood pressure remarkably 128/72, respiratory rate 20.  GENERAL: When I found him he was pale with choking and coughing.  HEENT:  Mucous membranes moist and intact.  CHEST:  Clear to auscultation bilaterally.  CARDIOVASCULAR:  Regular rate and rhythm.  Normal S1 and S2.  No S3, S4,  murmurs, gallops or rubs.  ABDOMEN:  Soft, nontender, nondistended.  EXTREMITIES:  No clubbing, cyanosis, or edema.   LABS:  On admission CBC:  White count 6.1, hemoglobin 4.5, hematocrit 15.8,  platelets 229.  PT 14.6, PTT  27.   ASSESSMENT:  A 55 year old gentleman with a history of prior anemia now with  recurrent anemia and a question of heme-positive stools.   PLAN:  1. Admit for 4 units of blood.  2.     Gastroenterology consultation.  3. Hematology consultation.  4. Will continue to follow him closely.                                               Corrie Mckusick, M.D.    JCG/MEDQ  D:  09/11/2002  T:  09/11/2002  Job:  [id number]

## 2010-08-11 NOTE — Op Note (Signed)
NAME:  Calvin Green, Calvin Green                          ACCOUNT NO.:  [id number]   MEDICAL RECORD NO.:  [id number]                   PATIENT TYPE:  INP   LOCATION:  A211                                 FACILITY:  [facility]   PHYSICIAN:  R. Roetta Sessions, M.D.              DATE OF BIRTH:  10/18/1955   DATE OF PROCEDURE:  09/12/2002  DATE OF DISCHARGE:                                 OPERATIVE REPORT   PROCEDURE:  Esophagogastroduodenoscopy with biopsy followed with colonoscopy  and snare polypectomy.   INDICATIONS FOR PROCEDURE:  The patient is a 55 year old gentleman admitted  to the hospital with profound microcytic anemia. He is Hemoccult positive.  He is essentially devoid of any GI symptoms. EGD and colonoscopy are now  being done to further assess his symptoms. This approach has been discussed  with the patient at length. The potential risks, benefits, and alternatives  have been reviewed, questions answered. He is agreeable. Please see my  dictated consultation note for more information.   MONITORING:  O2 saturation, blood pressure, pulse, and respirations were  monitored throughout the entirety of both procedures.   CONSCIOUS SEDATION:  Versed 4 mg IV, Demerol 75 mg IV in divided doses for  both procedures. Cetacaine spray for topical oropharyngeal anesthesia.   INSTRUMENTS:  Olympus video chip colonoscope and gastroscope.   FINDINGS:  Examination of the tubular esophagus revealed no mucosal  abnormalities.  The EG junction was easily traversed.   STOMACH:  The gastric cavity was empty and insufflated well with air. A  thorough examination of the gastric mucosa including a retroflexed view of  the proximal stomach and esophagogastric junction demonstrated somewhat  prominent folds throughout the stomach particularly prominent in the antrum.  There were two areas of red eroded centrally depressed volcano like  lesions on one of the folds in the antrum, please see photos. There did  not  appear to be an obvious infiltrating process and there was no ulcer  identified. The pylorus was patent and easily traversed. Examination of the  bulb, second and third portion demonstrated no abnormalities.   THERAPEUTIC/DIAGNOSTIC MANEUVERS:  Biopsy of the second and third portion of  the duodenum were taken for histologic studies. Biopsies of the prominent  folds where the volcano like lesions were located also taken in the  stomach. The patient tolerated the procedure well and was prepared for  colonoscopy. Digital rectal examination revealed no abnormalities.   ENDOSCOPIC FINDINGS:  The prep was good.   RECTUM:  Examination of the rectal mucosa including a retroflexing view of  the anal verge revealed no abnormalities.   COLON:  The colonic mucosa was surveyed from the rectosigmoid junction  through the left transverse right colon to the area of the appendiceal  orifice, ileocecal valve and cecum. These structures were seen and  photographed for the record. The patient was noted to have left sided  diverticula. The  remainder of the colonic mucosa to the cecum appeared  normal. The terminal ileum was intubated at 10 cm, this segment of GI tract  also appeared normal ( see photos). The scope was slowly withdrawn and all  previously mentioned mucosal surfaces were again seen. There was noted to be  a 5 mm polyp behind the fold at 40 cm, this was removed with the snare and  recovered through the scope. The patient tolerated the procedure well and  was reacted in endoscopy.   IMPRESSION:  1. Esophagogastroduodenoscopy normal esophagus.  2. Moderate size hiatal hernia somewhat prominent folds. Two areas of     centrally eroded volcano like erosions in the antrum of uncertain     significance (could be early leiomyoma). Biopsy of the remainder of the     stomach appeared normal. Normal D1 through D3, biopsies of D2 and D3     taken. Colonoscopy findings, normal rectum, left sided  diverticula, 5 mm     polyp at 40 cm removed with the snare. The remainder of the colonic     mucosa and terminal ileal mucosa appeared normal.   RECOMMENDATIONS:  1. Advance diet.  2. Followup on pathology.  3. Further recommendations/evaluation as an outpatient.                                               Jonathon Bellows, M.D.    RMR/MEDQ  D:  09/12/2002  T:  09/14/2002  Job:  [id number]   cc:   Corrie Mckusick, M.D.  [address] [place]. [place]  [place]  [place] [address]  Fax: [phone]

## 2010-08-11 NOTE — Consult Note (Signed)
NAME:  Calvin Green                          ACCOUNT NO.:  [id number]   MEDICAL RECORD NO.:  [id number]                   PATIENT TYPE:  INP   LOCATION:  A211                                 FACILITY:  [facility]   PHYSICIAN:  R. Roetta Sessions, M.D.              DATE OF BIRTH:  07-25-1955   DATE OF CONSULTATION:  09/11/2002  DATE OF DISCHARGE:                                   CONSULTATION   REASON FOR CONSULTATION:  1. Heme positive stool.  2. 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  He  apparently almost passed out at work trying to climb steps.  In the workup  on admission he was noted to have a H&H of 4.5 and 15.8 with an MCV of 53.9,  platelet count 229.  This morning his H&H 6.1 and 20.2, MCV 59.7.  He was  found to be occult blood positive.  Apparently he has been somewhat anemic  over the past couple of years.  He underwent a colonoscopy by Dr. Lovell Sheehan  two years ago.  He was reported to be okay.  [name] denies  odynophagia, dysphagia, or early sign of reflux symptoms nausea, vomiting,  abdominal pain, rectal bleeding, constipation, diarrhea.  He denies any  change in weight.  There is no family history of colorectal neoplasia.  He  is not a vegetarian.  He describes a wide variety of foods.   PAST MEDICAL HISTORY:  Unremarkable.   MEDICATIONS:  Has no prescribed medications.  No OTC agents.   PAST SURGICAL HISTORY:  1. Cyst removed from back.  2. Hemorrhoidal procedure.   ALLERGIES:  No known drug allergies.   FAMILY HISTORY:  Negative for chronic GI or liver disease aside from one  brother with pancreatitis.  Mother deceased, age 65, diabetes, coronary  artery disease.  Father is alive with hypertension.   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  No history of illicit drug use.  Former rather heavy drinker  for 20 years but cut back considerably the past ten years to one alcoholic  beverage weekly.  He chews tobacco.   REVIEW OF SYSTEMS:  As in history of present illness.   PHYSICAL EXAMINATION:  GENERAL:  A pleasant 55 year old gentleman resting  comfortably.  Alert, oriented x 3.  VITAL SIGNS:  Temp 97, pulse 58, respiratory rate 18, BP 109/73, weight  246.6, height 71 inches.  SKIN:  Warm and dry.  No jaundice.  HEENT:  No scleral icterus.  Conjunctivae are somewhat pale.  Oral cavity:  No lesions.  CHEST:  Lungs are clear to auscultation.  CARDIAC:  Regular, rate and rhythm without murmur,  gallop or rub.  ABDOMEN:  Nondistended.  Positive bowel sounds.  Soft, nontender without  appreciable mass, organomegaly.  EXTREMITIES:  No edema.   LABORATORY DATA:  As in history of present illness.  Pro time 14.6, INR 1.1.   IMPRESSION:  A 55 year old gentleman with profound microcytic anemia which  has been ongoing for some time but apparently has worsened significantly  recently manifest with presyncope, fatigue, dyspnea on exertion.  He is now  noted to be guaiac positive.  He reported he had a negative colonoscopy some  two years ago but those records are not available for review.  In this  setting the patient really needs to have both his upper and lower GI tract  evaluated.  It is best to have EGD and colonoscopy.  I have discussed this  approach with Mr. Bumgarner at length. The procedures have been reviewed and  questions answered and he is agreeable.  We will plan to perform an EG  colonoscopy on September 12, 2002.  I will try and retrieve  Dr. Lovell Sheehan colonoscopy report.   I would like to thank Dr. Colette Ribas for allowing me to see this  gentleman today.                                                 Jonathon Bellows, M.D.    RMR/MEDQ  D:  09/11/2002  T:  09/11/2002   Job:  [id number]   cc:   Corrie Mckusick, M.D.  [address] [place]. [place]  [place]  [place] [address]  Fax: [phone]

## 2010-08-11 NOTE — Discharge Summary (Signed)
   NAME:  Calvin Green, Calvin Green                          ACCOUNT NO.:  [id number]   MEDICAL RECORD NO.:  [id number]                   PATIENT TYPE:  INP   LOCATION:  A211                                 FACILITY:  [facility]   PHYSICIAN:  Corrie Mckusick, M.D.               DATE OF BIRTH:  10/30/1955   DATE OF ADMISSION:  09/10/2002  DATE OF DISCHARGE:  09/12/2002                                 DISCHARGE SUMMARY   HISTORY OF PRESENTING ILLNESS AND PAST MEDICAL HISTORY:  Please see  admission H&P.   HOSPITAL COURSE:  A 55 year old gentleman with history of prior anemia with  recurrent anemia and question of heme positive stools.  He presented with a  hemoglobin of 4.5.  He was admitted for GI consultation and aggressive  transfusion of packed red blood cells.  Hematology was also consulted.  Dr.  Jena Gauss saw the patient and discussed getting EGD and colonoscopy.  This was  all planned on September 12, 2002.   On the day after admission the patient had no complaints, felt quite well,  and looked tremendously improved.  Hemoglobin had increased to 6.1.  Again,  on the following day the patient had no complaints and had lightly bloody  pink bowel movements.  Hemoglobin had increased to 7.7 at that time.  EGD  and colonoscopy were performed that day showing left-sided diverticular  disease with small polyp at 40 cm.  EGD showed hiatal hernia; otherwise, no  significant source of bleed.  We also discussed further workup as an  outpatient with the patient.   The patient wanted to be discharged after the EGD and colonoscopy.  Dr.  Jena Gauss discharged the patient with following up with me on September 14, 2002.  The rest of the workup will be done as an outpatient.  He will also see  hematology as an outpatient as they were not able to see the patient in the  hospital.   DISCHARGE CONDITION:  Improved and stable.   DISCHARGE FOLLOW-UP:  As stated, two days after discharge with myself.                        Corrie Mckusick, M.D.    [place]  D:  10/13/2002  T:  10/13/2002  Job:  [id number]

## 2010-08-11 NOTE — H&P (Signed)
   NAME:  KACHE, MCCLURG                          ACCOUNT NO.:  [id number]   MEDICAL RECORD NO.:  [id number]                   PATIENT TYPE:  INP   LOCATION:  A211                                 FACILITY:  [facility]   PHYSICIAN:  Corrie Mckusick, M.D.               DATE OF BIRTH:  11-20-1955   DATE OF ADMISSION:  09/10/2002  DATE OF DISCHARGE:                                HISTORY & PHYSICAL   No dictation for this job.                                               Corrie Mckusick, M.D.    JCG/MEDQ  D:  09/11/2002  T:  09/11/2002  Job:  [id number]

## 2010-12-28 LAB — CROSSMATCH
ABO/RH(D): AB POS
Antibody Screen: NEGATIVE

## 2010-12-28 LAB — COMPREHENSIVE METABOLIC PANEL
ALT: 31 U/L (ref 0–53)
AST: 53 U/L — ABNORMAL HIGH (ref 0–37)
Alkaline Phosphatase: 48 U/L (ref 39–117)
CO2: 26 mEq/L (ref 19–32)
Chloride: 106 mEq/L (ref 96–112)
Creatinine, Ser: 0.98 mg/dL (ref 0.4–1.5)
GFR calc Af Amer: >60 mL/min (ref >60)
GFR calc non Af Amer: >60 mL/min (ref >60)
Potassium: 4.1 mEq/L (ref 3.5–5.1)
Total Bilirubin: 0.8 mg/dL (ref 0.3–1.2)

## 2010-12-28 LAB — CBC
HCT: 16.6 % — ABNORMAL LOW (ref 39.0–52.0)
HCT: 21.5 % — ABNORMAL LOW (ref 39.0–52.0)
Hemoglobin: 10.3 g/dL — ABNORMAL LOW (ref 13.0–17.0)
MCHC: 29.6 g/dL — ABNORMAL LOW (ref 30.0–36.0)
MCV: 56.1 fL — ABNORMAL LOW (ref 78.0–100.0)
MCV: 63.3 fL — ABNORMAL LOW (ref 78.0–100.0)
Platelets: 223 10*3/uL (ref 150–400)
Platelets: 273 10*3/uL (ref 150–400)
Platelets: 300 10*3/uL (ref 150–400)
RBC: 3.36 MIL/uL — ABNORMAL LOW (ref 4.22–5.81)
RDW: 24.1 % — ABNORMAL HIGH (ref 11.5–15.5)
RDW: 35 % — ABNORMAL HIGH (ref 11.5–15.5)
WBC: 5.8 10*3/uL (ref 4.0–10.5)
WBC: 6.3 10*3/uL (ref 4.0–10.5)
WBC: 6.4 10*3/uL (ref 4.0–10.5)
WBC: 6.5 10*3/uL (ref 4.0–10.5)

## 2010-12-28 LAB — LIPID PANEL
Cholesterol: 116 mg/dL (ref 0–200)
HDL: 27 mg/dL — ABNORMAL LOW (ref >39)
Triglycerides: 61 mg/dL (ref <150)

## 2010-12-28 LAB — FOLATE: Folate: 18.9 ng/mL

## 2010-12-28 LAB — IRON AND TIBC
Iron: 15 ug/dL — ABNORMAL LOW (ref 42–135)
Saturation Ratios: 3 % — ABNORMAL LOW (ref 20–55)
TIBC: 464 ug/dL — ABNORMAL HIGH (ref 215–435)
UIBC: 449 ug/dL

## 2010-12-28 LAB — DIFFERENTIAL
Basophils Absolute: 0.1 10*3/uL (ref 0.0–0.1)
Basophils Absolute: 0.1 10*3/uL (ref 0.0–0.1)
Basophils Relative: 0 % (ref 0–1)
Basophils Relative: 1 % (ref 0–1)
Eosinophils Absolute: 0.1 10*3/uL (ref 0.0–0.7)
Eosinophils Relative: 1 % (ref 0–5)
Eosinophils Relative: 2 % (ref 0–5)
Lymphocytes Relative: 15 % (ref 12–46)
Lymphocytes Relative: 20 % (ref 12–46)
Lymphs Abs: 0.8 10*3/uL (ref 0.7–4.0)
Lymphs Abs: 0.9 10*3/uL (ref 0.7–4.0)
Lymphs Abs: 1.3 10*3/uL (ref 0.7–4.0)
Monocytes Absolute: 0.3 10*3/uL (ref 0.1–1.0)
Monocytes Relative: 4 % (ref 3–12)
Neutro Abs: 4.3 10*3/uL (ref 1.7–7.7)
Neutro Abs: 4.9 10*3/uL (ref 1.7–7.7)
Neutro Abs: 5.2 10*3/uL (ref 1.7–7.7)

## 2010-12-28 LAB — URINALYSIS, ROUTINE W REFLEX MICROSCOPIC
Bilirubin Urine: NEGATIVE
Ketones, ur: NEGATIVE mg/dL
Nitrite: NEGATIVE
Urobilinogen, UA: 0.2 mg/dL (ref 0.0–1.0)
pH: 5.5 (ref 5.0–8.0)

## 2010-12-28 LAB — FERRITIN: Ferritin: 2 ng/mL — ABNORMAL LOW (ref 22–322)

## 2010-12-28 LAB — HEMOGLOBIN AND HEMATOCRIT, BLOOD
HCT: 26.7 % — ABNORMAL LOW (ref 39.0–52.0)
Hemoglobin: 8 g/dL — ABNORMAL LOW (ref 13.0–17.0)

## 2010-12-28 LAB — RETICULOCYTES
RBC.: 3.36 MIL/uL — ABNORMAL LOW (ref 4.22–5.81)
Retic Ct Pct: 2.9 % (ref 0.4–3.1)

## 2010-12-28 LAB — TISSUE TRANSGLUTAMINASE, IGA: Tissue Transglutaminase Ab, IgA: 0.4 U/mL (ref <7)

## 2010-12-28 LAB — ABO/RH: ABO/RH(D): AB POS

## 2010-12-28 LAB — VITAMIN B12: Vitamin B-12: 465 pg/mL (ref 211–911)

## 2010-12-28 LAB — HEMOGLOBIN A1C: Hgb A1c MFr Bld: 5.7 % (ref 4.6–6.1)

## 2010-12-28 LAB — RETICULIN AB, IGA: Reticulin Antibody, IgA: NEGATIVE

## 2010-12-28 LAB — URINE CULTURE: Colony Count: NO GROWTH

## 2010-12-28 LAB — APTT: aPTT: 28 seconds (ref 24–37)

## 2011-01-09 ENCOUNTER — Encounter (HOSPITAL_COMMUNITY): Payer: Self-pay | Attending: Oncology | Admitting: Oncology

## 2011-08-27 ENCOUNTER — Encounter: Payer: 59 | Attending: Family Medicine | Admitting: *Deleted

## 2011-08-27 DIAGNOSIS — E119 Type 2 diabetes mellitus without complications: Secondary | ICD-10-CM | POA: Insufficient documentation

## 2011-08-27 DIAGNOSIS — Z713 Dietary counseling and surveillance: Secondary | ICD-10-CM | POA: Insufficient documentation

## 2011-08-29 ENCOUNTER — Encounter: Payer: Self-pay | Admitting: *Deleted

## 2011-08-29 NOTE — Progress Notes (Signed)
  Patient was seen on 08/27/2011 for the first of a series of three diabetes self-management courses at the [facility].  Current A1c = 11.8%  The following learning objectives were met by the patient during this course:   Defines the role of glucose and insulin  Identifies type of diabetes and pathophysiology  Defines the diagnostic criteria for diabetes and prediabetes  States the risk factors for Type 2 Diabetes  States the symptoms of Type 2 Diabetes  Defines Type 2 Diabetes treatment goals  Defines Type 2 Diabetes treatment options  States the rationale for glucose monitoring  Identifies A1C, glucose targets, and testing times  Identifies proper sharps disposal  Defines the purpose of a diabetes food plan  Identifies carbohydrate food groups  Defines effects of carbohydrate foods on glucose levels  Identifies carbohydrate choices/grams/food labels  States benefits of physical activity and effect on glucose  Review of suggested activity guidelines  Handouts given during class include:  Type 2 Diabetes: Basics Book  My Food Plan Book  Food and Activity Log  Follow-Up Plan: Core Class 2

## 2011-08-29 NOTE — Patient Instructions (Signed)
Goals:  Follow Diabetes Meal Plan as instructed  Eat 3 meals and 2 snacks, every 3-5 hrs  Limit carbohydrate intake to 45-60 grams carbohydrate/meal  Limit carbohydrate intake to 0-30 grams carbohydrate/snack  Add lean protein foods to meals/snacks  Monitor glucose levels as instructed by your doctor  Aim for 30 mins of physical activity daily as tolerated  Bring food record and glucose log to your next nutrition visit 

## 2011-10-02 ENCOUNTER — Encounter: Payer: 59 | Attending: Family Medicine | Admitting: *Deleted

## 2011-10-02 ENCOUNTER — Encounter: Payer: Self-pay | Admitting: *Deleted

## 2011-10-02 DIAGNOSIS — E119 Type 2 diabetes mellitus without complications: Secondary | ICD-10-CM | POA: Insufficient documentation

## 2011-10-02 DIAGNOSIS — Z713 Dietary counseling and surveillance: Secondary | ICD-10-CM | POA: Insufficient documentation

## 2011-10-02 NOTE — Patient Instructions (Signed)
Goals:  Follow Diabetes Meal Plan as instructed  Eat 3 meals and 2 snacks, every 3-5 hrs  Limit carbohydrate intake to 60 grams carbohydrate/meal  Limit carbohydrate intake to 15-30 grams carbohydrate/snack  Add lean protein foods to meals/snacks  Monitor glucose levels as instructed by your doctor  Aim for 30 mins of physical activity daily  Bring food record and glucose log to your next nutrition visit

## 2011-10-02 NOTE — Progress Notes (Signed)
  Patient was seen on 10/02/11 for the second of a series of three diabetes self-management courses at the [facility]. The following learning objectives were met by the patient during this course:   Explain basic nutrition maintenance and quality assurance  Describe causes, symptoms and treatment of hypoglycemia and hyperglycemia  Explain how to manage diabetes during illness  Describe the importance of good nutrition for health and healthy eating strategies  List strategies to follow meal plan when dining out  Describe the effects of alcohol on glucose and how to use it safely  Describe problem solving skills for day-to-day glucose challenges  Describe strategies to use when treatment plan needs to change  Identify important factors involved in successful weight loss  Describe ways to remain physically active  Describe the impact of regular activity on insulin resistance   Handouts given in class:  [facility] Oral medication/insulin handout  Follow-Up Plan: Patient will attend the final class of the ADA Diabetes Self-Care Education.   

## 2011-10-16 DIAGNOSIS — IMO0001 Reserved for inherently not codable concepts without codable children: Secondary | ICD-10-CM

## 2011-10-23 NOTE — Progress Notes (Signed)
  Patient was seen on 10/16/2011 for the third of a series of three diabetes self-management courses at the [facility]. The following learning objectives were met by the patient during this course:    Describe how diabetes changes over time   Identify diabetes complications and ways to prevent them   Describe strategies that can promote heart health including lowering blood pressure and cholesterol   Describe strategies to lower dietary fat and sodium in the diet   Identify physical activities that benefit cardiovascular health   Evaluate success in meeting personal goal   Describe the belief that they can live successfully with diabetes day to day   Establish 2-3 goals that they will plan to diligently work on until they return for the free 85-month follow-up visit  The following handouts were given in class:  3 Month Follow Up Visit handout  Goal setting handout  Class evaluation form  Your patient has established the following 3 month goals for diabetes self-care:  Count carbohydrates at most of my meals and snacks  Reduce fat in my diet by eating less lunch meat at two or more meals a day.  Be active 30 minutes or more 5 times per week.  Increase my activity at least 1 day a week  Look for patterns in my record book at least 1 day per month  To help manage my stress, I will do surfing the web at least 3 times per week.  I will test my glucose at least 1 time per day 7 days per week  Follow-Up Plan: Patient will attend a 3 month follow-up visit for diabetes self-management education.

## 2012-01-04 ENCOUNTER — Encounter: Payer: Self-pay | Admitting: Gastroenterology

## 2012-01-15 ENCOUNTER — Encounter: Payer: 59 | Attending: Family Medicine | Admitting: Dietician

## 2012-01-15 NOTE — Progress Notes (Signed)
  Patient was seen on 01/15/2012 for their 3 month follow-up as a part of the diabetes self-management courses at the [facility]. The following learning objectives were met by your patient during this course:  Patient self reports the following:  Diabetes control has improved since diabetes self-management training: yes Number of days blood glucose is >200: none Last MD appointment for diabetes: September 2013 Changes in treatment plan: unchanged  Confidence with ability to manage diabetes: yes. Areas for improvement with diabetes self-care: Get off  the medications. Willingness to participate in diabetes support group: Has a family situation that limits his availability in the evenings. Blood Glucose:  Checking one time per day:  136,120, 115, 109, 116, 104  (Goal is for fasting < 100 to get off of his medication) Lat A1C was 6.7 % down from 11.8 % prior to classes.  GOALS: Is counting carbs at most meals and snacks. Notes that his trying to eat less fat at meals. Needs to work on being more active 5 days per week.  Currently getting in only 3. Working on reducing stress. Looking at patterns in glucose log.  Feels that monitoring glucose, getting more exercise and losing more weight will come closer to getting him off of his medication. Please see Diabetes Flow sheet for findings related to patient's self-care.  Follow-Up Plan: Patient is eligible for a "free" 30 minute diabetes self-care appointment in the next year. Patient to call and schedule as needed.

## 2012-01-18 ENCOUNTER — Encounter: Payer: Self-pay | Admitting: Dietician

## 2013-02-24 ENCOUNTER — Encounter: Payer: Self-pay | Admitting: Internal Medicine

## 2014-03-08 ENCOUNTER — Encounter (HOSPITAL_COMMUNITY): Payer: Self-pay | Admitting: Emergency Medicine

## 2014-03-08 ENCOUNTER — Emergency Department (HOSPITAL_COMMUNITY)
Admission: EM | Admit: 2014-03-08 | Discharge: 2014-03-08 | Disposition: A | Discharge status: Home / Self Care | Payer: 59 | Attending: Emergency Medicine | Admitting: Emergency Medicine

## 2014-03-08 DIAGNOSIS — M7031 Other bursitis of elbow, right elbow: Secondary | ICD-10-CM | POA: Diagnosis not present

## 2014-03-08 DIAGNOSIS — I10 Essential (primary) hypertension: Secondary | ICD-10-CM | POA: Insufficient documentation

## 2014-03-08 DIAGNOSIS — Z862 Personal history of diseases of the blood and blood-forming organs and certain disorders involving the immune mechanism: Secondary | ICD-10-CM | POA: Insufficient documentation

## 2014-03-08 DIAGNOSIS — Z79899 Other long term (current) drug therapy: Secondary | ICD-10-CM | POA: Insufficient documentation

## 2014-03-08 DIAGNOSIS — E669 Obesity, unspecified: Secondary | ICD-10-CM | POA: Diagnosis not present

## 2014-03-08 DIAGNOSIS — Y9389 Activity, other specified: Secondary | ICD-10-CM | POA: Insufficient documentation

## 2014-03-08 DIAGNOSIS — E119 Type 2 diabetes mellitus without complications: Secondary | ICD-10-CM | POA: Diagnosis not present

## 2014-03-08 DIAGNOSIS — M25421 Effusion, right elbow: Secondary | ICD-10-CM | POA: Diagnosis present

## 2014-03-08 DIAGNOSIS — M7021 Olecranon bursitis, right elbow: Secondary | ICD-10-CM

## 2014-03-08 HISTORY — DX: Type 2 diabetes mellitus without complications: E11.9

## 2014-03-08 HISTORY — DX: Gout, unspecified: M10.9

## 2014-03-08 LAB — CBG MONITORING, ED: GLUCOSE-CAPILLARY: 108 mg/dL — AB (ref 70–99)

## 2014-03-08 MED ORDER — DEXAMETHASONE 4 MG PO TABS: 4.0000 mg | ORAL_TABLET | Freq: Two times a day (BID) | ORAL | Status: DC

## 2014-03-08 MED ORDER — HYDROCODONE-ACETAMINOPHEN 5-325 MG PO TABS: 1.0000 | ORAL_TABLET | ORAL | Status: DC | PRN

## 2014-03-08 MED ORDER — CEPHALEXIN 500 MG PO CAPS
500.0000 mg | ORAL_CAPSULE | Freq: Four times a day (QID) | ORAL | Status: DC
Start: 2014-03-08 — End: 2021-05-02

## 2014-03-08 NOTE — ED Notes (Signed)
Pt reports edema and redness of the R elbow, started Fri. Pt denies any injury.

## 2014-03-08 NOTE — ED Provider Notes (Signed)
CSN: [id number]     Arrival date & time 03/08/14  1638 History  This chart was scribed for non-physician practitioner working with No att. providers found by Mercy Moore, ED Scribe. This patient was seen in room APFT21/APFT21 and the patient's care was started at 5:40 PM.   Chief Complaint  Patient presents with  . Joint Swelling   The history is provided by the patient. No language interpreter was used.   HPI Comments: Calvin Green is a 58 y.o. male who presents to the Emergency Department complaining of right elbow pain onset 3-4 days ago. Patient reports increase in swelling and redness present. He has increasing pain with extending the elbow and with certain movements of the wrist. Patient denies causative trauma or direct injury to the elbow. No fever, or chills. No operations or procedures involving elbow. Patient has history of gout, but states that this feels different than his usual gout pain. He has not taken pain medication. He attempted to reach his PCP but he is on vacation. He wen to Urgent Care and when they learned of his history of Diabetes, HTN and gout they told him to come to the ED.    Past Medical History  Diagnosis Date  . Anemia   . Fatty liver   . Hypertension   . Obesity   . Diabetes mellitus without complication   . Gout    History reviewed. No pertinent past surgical history. Family History  Problem Relation Age of Onset  . Hypertension Other   . Diabetes Other   . Gout Other    History  Substance Use Topics  . Smoking status: Never Smoker   . Smokeless tobacco: Current User    Types: Chew  . Alcohol Use: Yes     Comment: occas    Review of Systems  Constitutional: Negative for fever and chills.  Musculoskeletal: Positive for joint swelling and arthralgias.  All other systems reviewed and are negative.     Allergies  Review of patient's allergies indicates no known allergies.  Home Medications   Prior to Admission medications    Medication Sig Start Date End Date Taking? Authorizing Provider  Linagliptin-Metformin HCl (JENTADUETO) 2.5-500 MG TABS Take by mouth 2 (two) times daily.    Historical Provider, MD  lisinopril (PRINIVIL,ZESTRIL) 5 MG tablet Take 5 mg by mouth daily.    Historical Provider, MD   Triage Vitals: BP 160/94 mmHg  Pulse 85  Temp(Src) 98.2 F (36.8 C) (Oral)  Resp 16  Ht 5' 11.75" (1.822 m)  Wt 232 lb 5 oz (105.376 kg)  BMI 31.74 kg/m2  SpO2 100% Physical Exam  Constitutional: He is oriented to person, place, and time. He appears well-developed and well-nourished. No distress.  HENT:  Head: Normocephalic and atraumatic.  Eyes: EOM are normal.  Neck: Neck supple. No tracheal deviation present.  Cardiovascular: Normal rate.   Pulmonary/Chest: Effort normal. No respiratory distress.  Musculoskeletal: Normal range of motion.  Right elbow: good ROM of right shoulder. Pain with extension of the right elbow at the olecranon bursa. There is soreness with ROM of the wrist. Some elbow pain with pronation. Radial 2+ bilaterally. Cap refill < 2 seconds. No hot joints appreciated at lower or upper extremities.   Neurological: He is alert and oriented to person, place, and time.  Skin: Skin is warm and dry.  Psychiatric: He has a normal mood and affect. His behavior is normal.  Nursing note and vitals reviewed.   ED Course  Pt states that he saw on Google that elbow infections also look and feel like his symptoms. Pt request antibiotic coverage given his diabetic status. Reviewed vital signs and signs on exam with pt. He request antibiotic coverage. Rx for keflex given.  Procedures (including critical care time)  COORDINATION OF CARE: 6:12 PM- discussed use of sling, steroid pain medication and patient requests antibiotic due to Diabetic history.   Labs Review Labs Reviewed  CBG MONITORING, ED - Abnormal; Notable for the following:    Glucose-Capillary 108 (*)    All other components within  normal limits    Imaging Review No results found.   EKG Interpretation None      MDM No evidence for septic joint. Vital signs are within normal limits. Suspect bursitis. Pt will be treated with decadron and norco. Pt to follow up with Dr Aline Brochure if not improving.   Final diagnoses:  Bursitis of elbow, right    *I have reviewed nursing notes, vital signs, and all appropriate lab and imaging results for this patient.**  I personally performed the services described in this documentation, which was scribed in my presence. The recorded information has been reviewed and is accurate.    Lenox Ahr, PA-C 03/08/14 [place], DO 03/10/14 [phone]

## 2014-03-08 NOTE — Discharge Instructions (Signed)
Please see Dr Aline Brochure for additional management if not improving. Olecranon Bursitis  A bursa is a fluid-filled sac that covers and protects a joint. Bursitis is when the fluid-filled sac gets puffy and sore (inflamed). Olecranon bursitis occurs over the elbow. It is often caused by falling on the elbow, a joint disorder (arthritis), or infection. It can also be caused by overuse of the elbow joint. HOME CARE  Put ice on the affected area.  Put ice in a plastic bag.  Place a towel between your skin and the bag.  Leave the ice on for 15-20 minutes each hour while awake. Do this for the first 2 days.  Raise (elevate) your elbow above your heart when resting.  Bend, straighten, and move your joint 4 times a day. Rest the injured joint at other times. When you have less pain, begin slow movements and usual activities.  Only take medicine as told by your doctor.  Limit the amount of dairy you eat and drink (milk, cheese, yogurt). GET HELP RIGHT AWAY IF:   You have more pain, even with treatment.  You have a fever.  You have warmth and irritation over the fluid-filled sac and elbow.  You have a red line going up your arm.  You have pain when you move your elbow. MAKE SURE YOU:   Understand these instructions.  Will watch your condition.  Will get help right away if you are not doing well or get worse. Document Released: 08/30/2009 Document Revised: 07/27/2013 Document Reviewed: 08/30/2009 [facility] Patient Information 2015 [place], [place]. This information is not intended to replace advice given to you by your health care provider. Make sure you discuss any questions you have with your health care provider.

## 2014-03-08 NOTE — ED Notes (Signed)
Pt comes in with left elbow  Swelling with redness. Pt moves his elbow freely and states it only hurts when he extends it as far as he can or flexes it. Pt denies any injury. States the pain started on Friday with a tingle and has increased ever since. NAD noted at this time.

## 2015-11-25 ENCOUNTER — Emergency Department (HOSPITAL_COMMUNITY): Payer: 59

## 2015-11-25 ENCOUNTER — Encounter (HOSPITAL_COMMUNITY): Payer: Self-pay | Admitting: Emergency Medicine

## 2015-11-25 ENCOUNTER — Emergency Department (HOSPITAL_COMMUNITY)
Admission: EM | Admit: 2015-11-25 | Discharge: 2015-11-25 | Disposition: A | Discharge status: Home / Self Care | Payer: 59 | Attending: Emergency Medicine | Admitting: Emergency Medicine

## 2015-11-25 DIAGNOSIS — Y929 Unspecified place or not applicable: Secondary | ICD-10-CM | POA: Insufficient documentation

## 2015-11-25 DIAGNOSIS — Z7984 Long term (current) use of oral hypoglycemic drugs: Secondary | ICD-10-CM | POA: Diagnosis not present

## 2015-11-25 DIAGNOSIS — Y939 Activity, unspecified: Secondary | ICD-10-CM | POA: Diagnosis not present

## 2015-11-25 DIAGNOSIS — S0093XA Contusion of unspecified part of head, initial encounter: Secondary | ICD-10-CM | POA: Insufficient documentation

## 2015-11-25 DIAGNOSIS — E119 Type 2 diabetes mellitus without complications: Secondary | ICD-10-CM | POA: Insufficient documentation

## 2015-11-25 DIAGNOSIS — T1490XA Injury, unspecified, initial encounter: Secondary | ICD-10-CM

## 2015-11-25 DIAGNOSIS — S0990XA Unspecified injury of head, initial encounter: Secondary | ICD-10-CM

## 2015-11-25 DIAGNOSIS — Z79899 Other long term (current) drug therapy: Secondary | ICD-10-CM | POA: Insufficient documentation

## 2015-11-25 DIAGNOSIS — I10 Essential (primary) hypertension: Secondary | ICD-10-CM | POA: Diagnosis not present

## 2015-11-25 DIAGNOSIS — W0110XA Fall on same level from slipping, tripping and stumbling with subsequent striking against unspecified object, initial encounter: Secondary | ICD-10-CM | POA: Diagnosis not present

## 2015-11-25 DIAGNOSIS — Y999 Unspecified external cause status: Secondary | ICD-10-CM | POA: Diagnosis not present

## 2015-11-25 DIAGNOSIS — F17220 Nicotine dependence, chewing tobacco, uncomplicated: Secondary | ICD-10-CM | POA: Diagnosis not present

## 2015-11-25 NOTE — ED Triage Notes (Signed)
Patient reports fall and hitting head. C/o posterior HA, denies visual changes, no loss of bladder or bowel, no numbness or tingling. A&O x4.

## 2015-11-25 NOTE — ED Provider Notes (Signed)
[address] DEPT Provider Note   CSN: [id number] Arrival date & time: 11/25/15  1747  By signing my name below, I, Calvin Green, attest that this documentation has been prepared under the direction and in the presence of non-physician practitioner, Okey Regal, PA-C. Electronically Signed: Jeanell Green, Scribe. 11/25/2015. 6:13 PM.  History   Chief Complaint Chief Complaint  Patient presents with  . Fall   The history is provided by the patient. No language interpreter was used.   HPI Comments:  Calvin Green is a 60 y.o. male who presents to the Emergency Department complaining of a fall that occurred today. He reports that he slippedOn water and fell backwards on to his head. He denies any LOC. He reports having constant moderate headache and shoulder pain. He denies any use of blood thinners, neck pain, numbness/tingling, or weakness.  Past Medical History:  Diagnosis Date  . Anemia   . Diabetes mellitus without complication ([place])   . Fatty liver   . Gout   . Hypertension   . Obesity     Patient Active Problem List   Diagnosis Date Noted  . FATTY LIVER DISEASE 01/16/2010  . TRANSAMINASES, SERUM, ELEVATED 01/16/2010  . 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 ANEMIA, IRON DEFICIENCY, CHRONIC 01/12/2010  . ALCOHOLIC FATTY LIVER [id number]  . CHOLELITHIASIS 01/12/2010  . GASTRITIS, HX OF 01/12/2010    History reviewed. No pertinent surgical history.    Home Medications    Prior to Admission medications   Medication Sig Start Date End Date Taking? Authorizing Provider  acetaminophen (TYLENOL) 500 MG tablet Take 500 mg by mouth every 6 (six) hours as needed for mild pain, moderate pain or fever.    Historical Provider, MD  cephALEXin (KEFLEX) 500 MG capsule Take 1 capsule (500 mg total) by mouth 4 (four) times daily. 03/08/14   Lily Kocher, PA-C  dexamethasone (DECADRON) 4 MG tablet Take 1 tablet (4 mg total) by mouth 2 (two) times daily with a meal.  03/08/14   Lily Kocher, PA-C  HYDROcodone-acetaminophen (NORCO/VICODIN) 5-325 MG per tablet Take 1 tablet by mouth every 4 (four) hours as needed. 03/08/14   Lily Kocher, PA-C  IRON PO Take 1 tablet by mouth every 3 (three) days.    Historical Provider, MD  Linagliptin-Metformin HCl (JENTADUETO) 2.5-500 MG TABS Take 1 tablet by mouth 2 (two) times daily.     Historical Provider, MD  lisinopril (PRINIVIL,ZESTRIL) 5 MG tablet Take 5 mg by mouth every evening.     Historical Provider, MD    Family History Family History  Problem Relation Age of Onset  . Hypertension Other   . Diabetes Other   . Gout Other     Social History Social History  Substance Use Topics  . Smoking status: Never Smoker  . Smokeless tobacco: Current User    Types: Chew  . Alcohol use Yes     Comment: occas     Allergies   Review of patient's allergies indicates no known allergies.   Review of Systems Review of Systems A complete 10 system review of systems was obtained and all systems are negative except as noted in the HPI and PMH.   Physical Exam Updated Vital Signs BP 161/96 (BP Location: Left Arm)   Pulse 79   Temp 97.9 F (36.6 C) (Oral)   Resp 15   Ht 5\' 11"  (1.803 m)   Wt 225 lb (102.1 kg)   SpO2 98%  BMI 31.38 kg/m   Physical Exam  Constitutional: He appears well-developed and well-nourished. No distress.  HENT:  Head: Normocephalic and atraumatic.  Large hematoma to the posterior aspect of the head, no obvious bleeding, no surrounding bony abnormality. No signs of basilar skull fracture  Eyes: Conjunctivae are normal.  Neck: Neck supple.  Cardiovascular: Normal rate.   Pulmonary/Chest: Effort normal.  Abdominal: Soft.  Musculoskeletal: Normal range of motion.  Patient has no CT or L-spine tenderness, full active range of motion of the neck without pain. Patient has minor tenderness to palpation of the soft tissue medial to the scapula, no bony tenderness of the scapula. Upper  Cherry strength 5 out of 5, full active range of motion. Sensation grossly intact.  Neurological: He is alert. He has normal strength. No cranial nerve deficit or sensory deficit. GCS eye subscore is 4. GCS verbal subscore is 5. GCS motor subscore is 6.  Skin: Skin is warm and dry.  Psychiatric: He has a normal mood and affect.  Nursing note and vitals reviewed.    ED Treatments / Results  DIAGNOSTIC STUDIES: Oxygen Saturation is 98% on RA, normal by my interpretation.    COORDINATION OF CARE: 6:17 PM- Pt advised of plan for treatment and pt agrees.  Labs (all labs ordered are listed, but only abnormal results are displayed) Labs Reviewed - No data to display  EKG  EKG Interpretation None       Radiology Ct Head Wo Contrast  Result Date: 11/25/2015 CLINICAL DATA:  Patient fell and struck head.  Posterior headache. EXAM: CT HEAD WITHOUT CONTRAST TECHNIQUE: Contiguous axial images were obtained from the base of the skull through the vertex without intravenous contrast. COMPARISON:  None. FINDINGS: Brain: Ventricles and sulci appear symmetrical. No ventricular dilatation. No mass effect or midline shift. No abnormal extra-axial fluid collections. Gray-white matter junctions are distinct. Basal cisterns are not effaced. No evidence of acute intracranial hemorrhage. Vascular: No hyperdense vessel or unexpected calcification. Skull: No depressed skull fractures. Sinuses/Orbits: No acute finding. Other: Moderately large subcutaneous scalp hematoma over the left posterior parietal region. IMPRESSION: No acute intracranial abnormalities. Electronically Signed   By: Lucienne Capers M.D.   On: 11/25/2015 19:19    Procedures Procedures (including critical care time)  Medications Ordered in ED Medications - No data to display   Initial Impression / Assessment and Plan / ED Course  I have reviewed the triage vital signs and the nursing notes.  Pertinent labs & imaging results that were  available during my care of the patient were reviewed by me and considered in my medical decision making (see chart for details).  Clinical Course      Final Clinical Impressions(s) / ED Diagnoses   Final diagnoses:  Head injury, initial encounter   Labs:  Imaging: CT head without  Consults:  Therapeutics:  Discharge Meds:   Assessment/Plan:  60 year old male presents status post fall. Patient had a mechanical fall striking the back of his head. He has significant hematoma to the back of the head, no open bleeding or wounds. Patient has no surrounding tenderness to the bony structures of the skull, but due to significant hematoma I am unable to assess underlying bones. Patient has no neurological deficits, no other concerning signs or symptoms on my exam. CT head showed no intracranial abnormality. Patient was stable here in the ED, he had improvement in his headache or urine ED, continued to deny any neurological complaints. Patient has not any blood thinners, has  not had any significant head trauma in the past. Patient's wife is at bedside, he will be discharged home with strict return precautions and follow-up information. Both the patient and his wife verbalize understanding and agreement to today's plan had no further questions concerns that some discharge.     New Prescriptions Discharge Medication List as of 11/25/2015  7:37 PM     I personally performed the services described in this documentation, which was scribed in my presence. The recorded information has been reviewed and is accurate.    Okey Regal, PA-C 11/25/15 2109    Sherwood Gambler, MD 11/27/15 [phone]

## 2015-11-25 NOTE — Discharge Instructions (Signed)
Please monitor closely for any new or worsening signs or symptoms. Please return immediately to the emergency room if you experience any. Please follow-up with her primary care provider in one week for reevaluation.

## 2016-09-14 ENCOUNTER — Emergency Department (HOSPITAL_COMMUNITY): Payer: 59

## 2016-09-14 ENCOUNTER — Emergency Department (HOSPITAL_COMMUNITY)
Admission: EM | Admit: 2016-09-14 | Discharge: 2016-09-14 | Disposition: A | Discharge status: Home / Self Care | Payer: 59 | Attending: Emergency Medicine | Admitting: Emergency Medicine

## 2016-09-14 ENCOUNTER — Encounter (HOSPITAL_COMMUNITY): Payer: Self-pay | Admitting: Emergency Medicine

## 2016-09-14 DIAGNOSIS — Y9389 Activity, other specified: Secondary | ICD-10-CM | POA: Diagnosis not present

## 2016-09-14 DIAGNOSIS — S0003XA Contusion of scalp, initial encounter: Secondary | ICD-10-CM | POA: Diagnosis not present

## 2016-09-14 DIAGNOSIS — W108XXA Fall (on) (from) other stairs and steps, initial encounter: Secondary | ICD-10-CM | POA: Insufficient documentation

## 2016-09-14 DIAGNOSIS — Y999 Unspecified external cause status: Secondary | ICD-10-CM | POA: Insufficient documentation

## 2016-09-14 DIAGNOSIS — S0990XA Unspecified injury of head, initial encounter: Secondary | ICD-10-CM | POA: Diagnosis present

## 2016-09-14 DIAGNOSIS — I10 Essential (primary) hypertension: Secondary | ICD-10-CM | POA: Diagnosis not present

## 2016-09-14 DIAGNOSIS — Z79899 Other long term (current) drug therapy: Secondary | ICD-10-CM | POA: Insufficient documentation

## 2016-09-14 DIAGNOSIS — E119 Type 2 diabetes mellitus without complications: Secondary | ICD-10-CM | POA: Diagnosis not present

## 2016-09-14 DIAGNOSIS — W19XXXA Unspecified fall, initial encounter: Secondary | ICD-10-CM

## 2016-09-14 DIAGNOSIS — Y9289 Other specified places as the place of occurrence of the external cause: Secondary | ICD-10-CM | POA: Insufficient documentation

## 2016-09-14 DIAGNOSIS — Z23 Encounter for immunization: Secondary | ICD-10-CM | POA: Diagnosis not present

## 2016-09-14 MED ORDER — TETANUS-DIPHTH-ACELL PERTUSSIS 5-2.5-18.5 LF-MCG/0.5 IM SUSP
0.5000 mL | Freq: Once | INTRAMUSCULAR | Status: AC
  Administered 2016-09-14: 0.5 mL via INTRAMUSCULAR
  Filled 2016-09-14: qty 0.5

## 2016-09-14 NOTE — ED Provider Notes (Signed)
[facility] DEPT Provider Note   CSN: [id number] Arrival date & time: 09/14/16  1696     History   Chief Complaint Chief Complaint  Patient presents with  . Head Injury    HPI HOMMER CUNLIFFE is a 61 y.o. male.  The history is provided by the patient.  Head Injury   The incident occurred 1 to 2 hours ago. He came to the ER via walk-in. The injury mechanism was a fall. There was no loss of consciousness. The volume of blood lost was minimal. The quality of the pain is described as throbbing. The pain is moderate. The pain has been constant since the injury. Associated symptoms include disorientation. Pertinent negatives include no numbness, no blurred vision, no vomiting, no weakness and no memory loss. He has tried nothing for the symptoms. The treatment provided no relief.   61 year old male who presents with fall. Slipped down 3-4 steps this morning and hit head on concrete. No LOC but wife states initially a little disoriented x a few minutes. Able to get up and ambulate and since has been normal mental status. No nausea, vomiting, vision or speech changes, chest pain, abdominal pain, neck pain or back pain. No anticoagulation.   Past Medical History:  Diagnosis Date  . Anemia   . Diabetes mellitus without complication ([place])   . Fatty liver   . Gout   . Hypertension   . Obesity     Patient Active Problem List   Diagnosis Date Noted  . FATTY LIVER DISEASE 01/16/2010  . TRANSAMINASES, SERUM, ELEVATED 01/16/2010  . 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 ANEMIA, IRON DEFICIENCY, CHRONIC 01/12/2010  . ALCOHOLIC FATTY LIVER 78/93/8101  . CHOLELITHIASIS 01/12/2010  . GASTRITIS, HX OF 01/12/2010    History reviewed. No pertinent surgical history.     Home Medications    Prior to Admission medications   Medication Sig Start Date End Date Taking? Authorizing Provider  acetaminophen (TYLENOL) 500 MG tablet Take 500 mg by mouth every 6 (six) hours as needed for mild  pain, moderate pain or fever.    [provider]  cephALEXin (KEFLEX) 500 MG capsule Take 1 capsule (500 mg total) by mouth 4 (four) times daily. 03/08/14   Lily Kocher, PA-C  dexamethasone (DECADRON) 4 MG tablet Take 1 tablet (4 mg total) by mouth 2 (two) times daily with a meal. 03/08/14   Lily Kocher, PA-C  HYDROcodone-acetaminophen (NORCO/VICODIN) 5-325 MG per tablet Take 1 tablet by mouth every 4 (four) hours as needed. 03/08/14   Lily Kocher, PA-C  IRON PO Take 1 tablet by mouth every 3 (three) days.    [provider]  Linagliptin-Metformin HCl (JENTADUETO) 2.5-500 MG TABS Take 1 tablet by mouth 2 (two) times daily.     [provider]  lisinopril (PRINIVIL,ZESTRIL) 5 MG tablet Take 5 mg by mouth every evening.     [provider]    Family History Family History  Problem Relation Age of Onset  . Hypertension Other   . Diabetes Other   . Gout Other     Social History Social History  Substance Use Topics  . Smoking status: Never Smoker  . Smokeless tobacco: Current User    Types: Chew  . Alcohol use Yes     Comment: occas     Allergies   Patient has no known allergies.   Review of Systems Review of Systems  Constitutional: Negative for fever.  Eyes: Negative for blurred vision.  Respiratory: Negative for shortness of breath.   Cardiovascular: Negative for chest pain.  Gastrointestinal: Negative for abdominal pain and vomiting.  Musculoskeletal: Negative for back pain and neck pain.  Neurological: Negative for weakness and numbness.  Psychiatric/Behavioral: Negative for memory loss.  All other systems reviewed and are negative.    Physical Exam Updated Vital Signs BP (!) 152/92 (BP Location: Left Arm)   Pulse 81   Temp 97.6 F (36.4 C) (Oral)   Resp 16   Ht 5\' 11"  (1.803 m)   Wt 102.1 kg (225 lb)   SpO2 100%   BMI 31.38 kg/m   Physical Exam Physical Exam  Nursing note and vitals reviewed. Constitutional:  Well developed, well nourished, non-toxic, and in no acute distress Head: Normocephalic and large hematoma in the left posterolateral occiput.  Mouth/Throat: Oropharynx is clear and moist.  Neck: Normal range of motion. Neck supple. no cervical spine tenderness Cardiovascular: Normal rate and regular rhythm.   Pulmonary/Chest: Effort normal and breath sounds normal.  Abdominal: Soft. There is no tenderness. There is no rebound and no guarding.  Musculoskeletal: Normal range of motion of all 4 extremities. no TLs spine tenderness Neurological: Alert, no facial droop, fluent speech, moves all extremities symmetrically, PERRL, EOMI, sensation to light touch in tact throughout, full strength upper and lower extremities Skin: Skin is warm and dry. superficial punctate laceration to the left elbow Psychiatric: Cooperative   ED Treatments / Results  Labs (all labs ordered are listed, but only abnormal results are displayed) Labs Reviewed - No data to display  EKG  EKG Interpretation None       Radiology Ct Head Wo Contrast  Result Date: 09/14/2016 CLINICAL DATA:  61 year old male status post fall down steps with head injury. Large left scalp hematoma. Disoriented. EXAM: CT HEAD WITHOUT CONTRAST TECHNIQUE: Contiguous axial images were obtained from the base of the skull through the vertex without intravenous contrast. COMPARISON:  11/25/2015 head CT. FINDINGS: Brain: Cerebral volume is stable and within normal limits. No midline shift, ventriculomegaly, mass effect, evidence of mass lesion, intracranial hemorrhage or evidence of cortically based acute infarction. Gray-white matter differentiation is stable throughout the brain and normal for age. Vascular: Calcified atherosclerosis at the skull base. No suspicious intracranial vascular hyperdensity. Skull: Stable and intact.  No acute osseous abnormality identified. Sinuses/Orbits: Visualized paranasal sinuses and mastoids are stable and well  pneumatized. Other: The large left posterior convexity scalp hematoma seen in 2017 has resolved with mild residual soft tissue scarring. There may be a mild superimposed left scalp contusion in the same area today (series 3, image 49). No other scalp hematoma. Stable orbits soft tissues. Negative visualized noncontrast deep soft tissue spaces of the face. IMPRESSION: 1. Possible mild acute scalp soft tissue injury at the same site as the large left scalp hematoma seen in 2017. No skull fracture. 2. Stable and normal for age noncontrast CT appearance of the brain. Electronically Signed   By: Genevie Ann M.D.   On: 09/14/2016 07:25    Procedures Procedures (including critical care time)  Medications Ordered in ED Medications  Tdap (BOOSTRIX) injection 0.5 mL (not administered)     Initial Impression / Assessment and Plan / ED Course  I have reviewed the triage vital signs and the nursing notes.  Pertinent labs & imaging results that were available during my care of the patient were reviewed by me and considered in my medical decision making (see chart for details).     61 year old male  who presents with fall and head injury. Well-appearing and in no acute distress with normal mentation and no focal neurological deficits. Large scalp hematoma noted. A small punctate laceration to the left elbow. No other acute injuries. CT head visualized and shows no acute intracranial processes. Tetanus is updated. Appropriate for discharge home for continued supportive management. Strict return and follow-up instructions reviewed. He expressed understanding of all discharge instructions and felt comfortable with the plan of care.   Final Clinical Impressions(s) / ED Diagnoses   Final diagnoses:  Injury of head, initial encounter  Fall, initial encounter  Hematoma of scalp, initial encounter    New Prescriptions New Prescriptions   No medications on file     Forde Dandy, MD 09/14/16 [phone]

## 2016-09-14 NOTE — ED Triage Notes (Addendum)
Pt reports that he tripped going down steps this morning at 0545 and hit app 3-4 steps hitting head and left shoulder. Pt denies any LOC. Contusion noted to left posterior portion of head. Pt also reports left elbow injury from fall

## 2016-09-14 NOTE — Discharge Instructions (Signed)
Your CT head does not show serious head injury. Ice to keep swelling down. Take tylenol or ibuprofen for minor pain Return for worsening symptoms, including confusion, difficulty walking, severe headaches, intractable vomiting, or any other symptoms concerning to you.

## 2017-02-21 ENCOUNTER — Ambulatory Visit (HOSPITAL_COMMUNITY)
Admission: RE | Admit: 2017-02-21 | Discharge: 2017-02-21 | Disposition: A | Discharge status: Home / Self Care | Payer: 59 | Source: Ambulatory Visit | Attending: Family Medicine | Admitting: Family Medicine

## 2017-02-21 ENCOUNTER — Other Ambulatory Visit (HOSPITAL_COMMUNITY): Payer: Self-pay | Admitting: Family Medicine

## 2017-02-21 DIAGNOSIS — M7989 Other specified soft tissue disorders: Secondary | ICD-10-CM

## 2017-03-13 ENCOUNTER — Telehealth: Payer: Self-pay | Admitting: Internal Medicine

## 2017-03-13 NOTE — Telephone Encounter (Signed)
Pt's wife called to say that patient wanted [place] to fax him a release of records to [phone]. He was upset with [place] because he couldn't see RMR instead of extender and is transferring his GI care elsewhere. He has OV with new GI doctor on January 11th and will need the records before then or he can't be seen. I told the wife that the copying service will be here on Friday if he can get the releases back to me before then. He wants copies for himself and for the new GI doctor. Wife was wanting to pick them up, but I told her the copying service will mail them to her because it'll be a fee.

## 2017-03-16 NOTE — Telephone Encounter (Signed)
Patient can have his new GI doctor send [place] a release and Manuela Schwartz you can fax his records to that doctor.

## 2017-03-18 NOTE — Telephone Encounter (Signed)
Pt signed a release for himself and for his new GI. [employer] has processed this and patient picked up his records.

## 2017-03-29 ENCOUNTER — Other Ambulatory Visit: Payer: Self-pay | Admitting: Gastroenterology

## 2017-03-29 DIAGNOSIS — R945 Abnormal results of liver function studies: Principal | ICD-10-CM

## 2017-03-29 DIAGNOSIS — R7989 Other specified abnormal findings of blood chemistry: Secondary | ICD-10-CM

## 2017-04-01 ENCOUNTER — Ambulatory Visit
Admission: RE | Admit: 2017-04-01 | Discharge: 2017-04-01 | Disposition: A | Discharge status: Home / Self Care | Payer: 59 | Source: Ambulatory Visit | Attending: Gastroenterology | Admitting: Gastroenterology

## 2017-04-01 DIAGNOSIS — R945 Abnormal results of liver function studies: Principal | ICD-10-CM

## 2017-04-01 DIAGNOSIS — R7989 Other specified abnormal findings of blood chemistry: Secondary | ICD-10-CM

## 2017-04-04 ENCOUNTER — Ambulatory Visit
Admission: RE | Admit: 2017-04-04 | Discharge: 2017-04-04 | Disposition: A | Discharge status: Home / Self Care | Payer: 59 | Source: Ambulatory Visit | Attending: Gastroenterology | Admitting: Gastroenterology

## 2017-10-23 IMAGING — CT CT HEAD W/O CM
3 of 4 series · 15 of 47 positions shown, 18 images · non-contrast
Comparison: 11/25/2015 head CT.

CLINICAL DATA: 60-year-old male status post fall down steps with
head injury. Large left scalp hematoma. Disoriented.

EXAM:
CT HEAD WITHOUT CONTRAST
TECHNIQUE: Contiguous axial images were obtained from the base of the skull
through the vertex without intravenous contrast.

[Series 2: head w/o · axial · non-contrast · 0.45mm/px · z∈[-110,+25]mm · 9 of 35 slices shown, 12 images]
[im 4/35  brain]
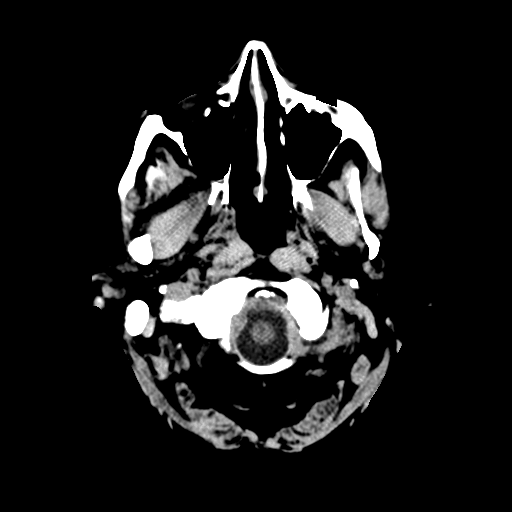
[im 4/35  bone]
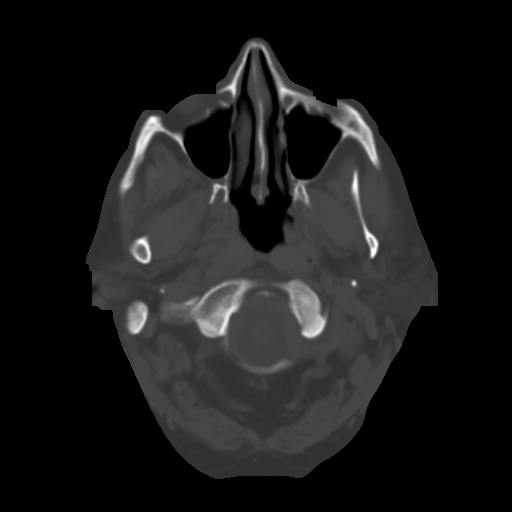
[im 7/35  brain]
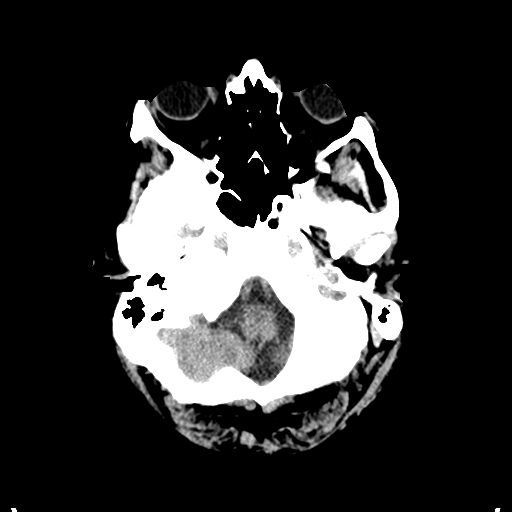
[im 11/35  brain]
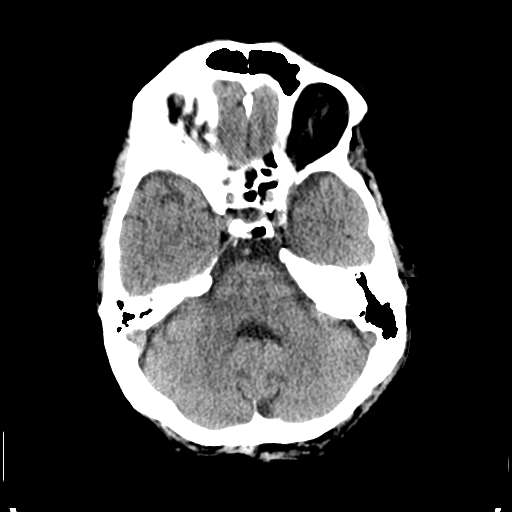
[im 14/35  brain]
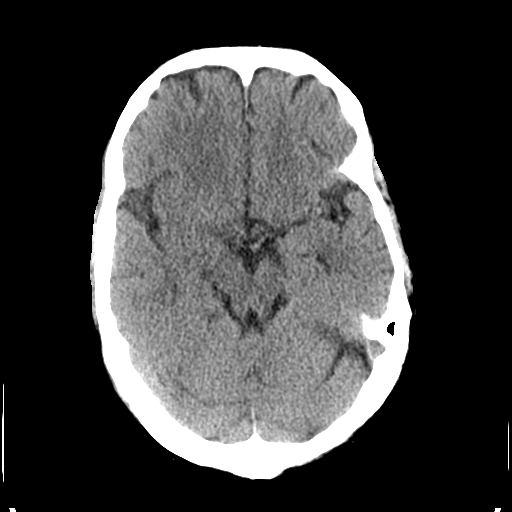
[im 18/35  brain]
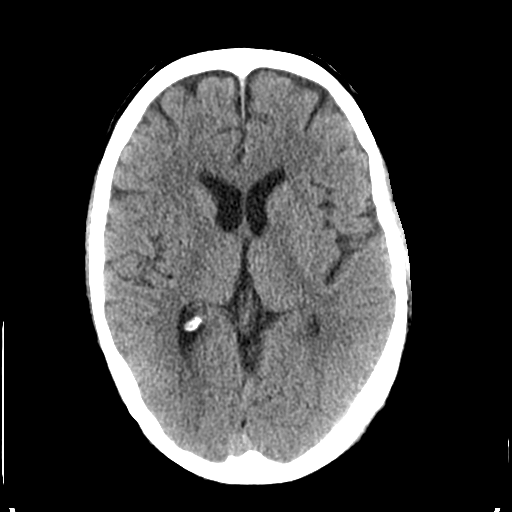
[im 18/35  bone]
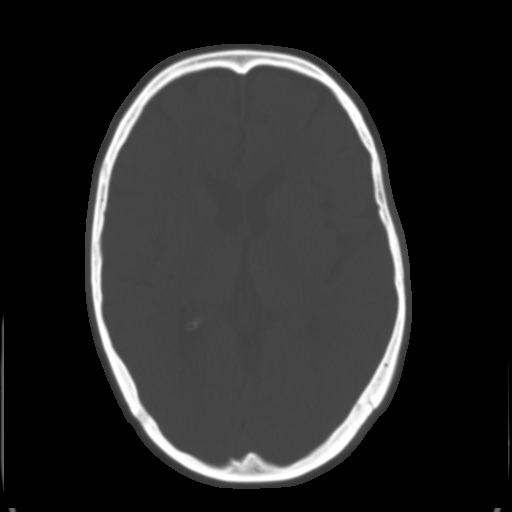
[im 21/35  brain]
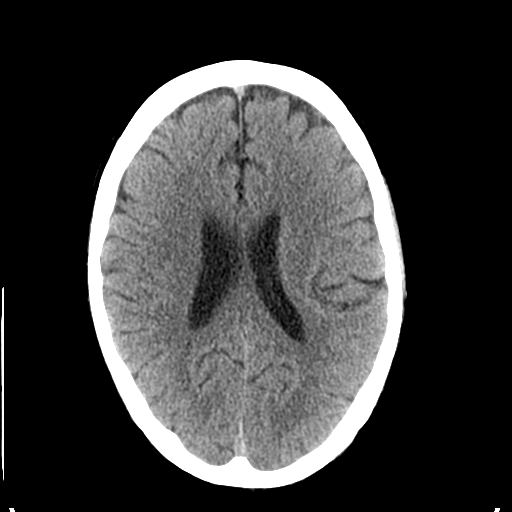
[im 24/35  brain]
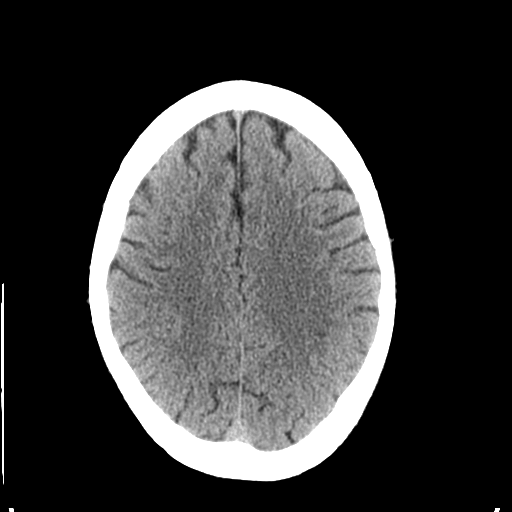
[im 28/35  brain]
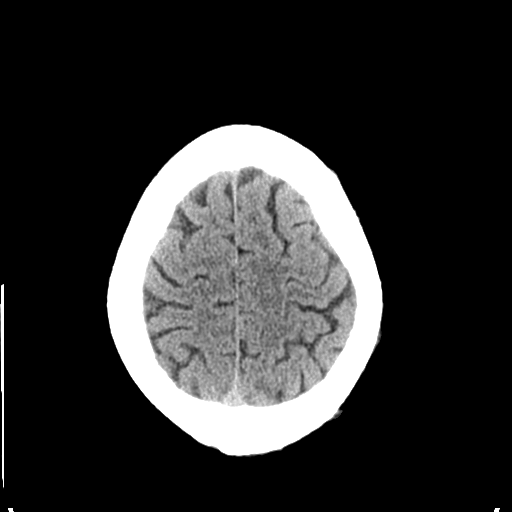
[im 31/35  brain]
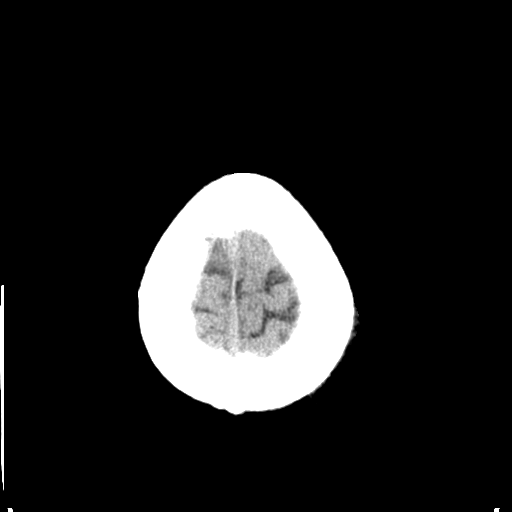
[im 31/35  bone]
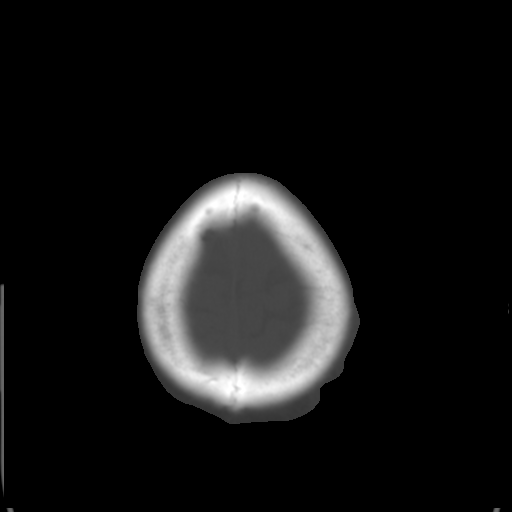

[Series 4: coronal · coronal · 0.30mm/px · 3 of 66 slices shown]
[im 22/66  brain]
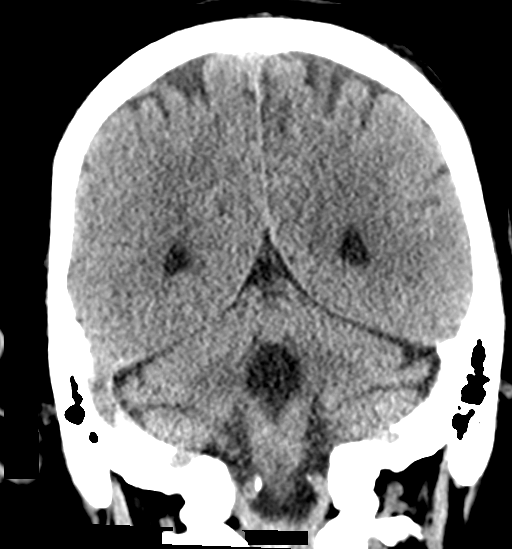
[im 29/66  brain]
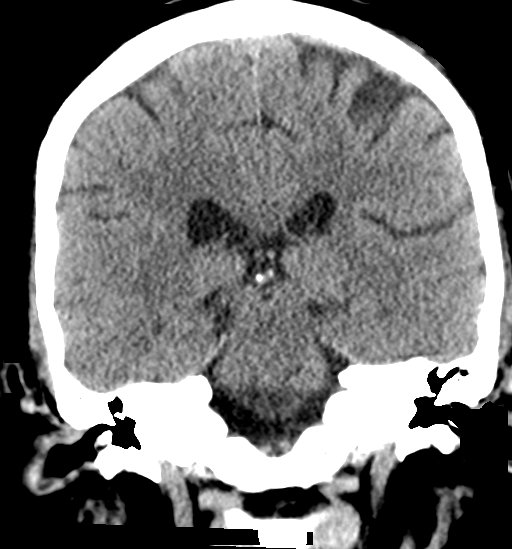
[im 37/66  brain]
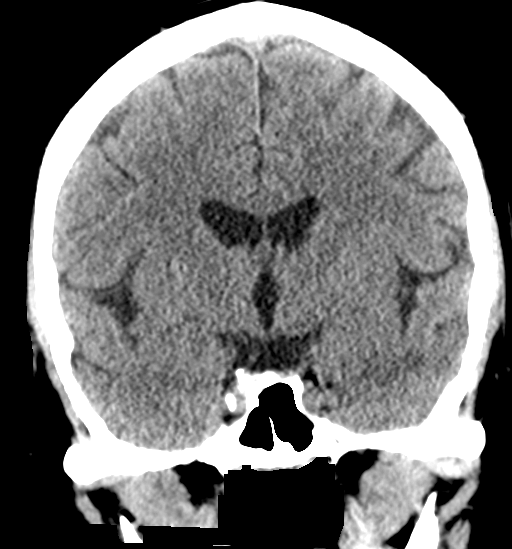

[Series 5: sagittal · sagittal · 0.32mm/px · 3 of 48 slices shown]
[im 16/48  brain]
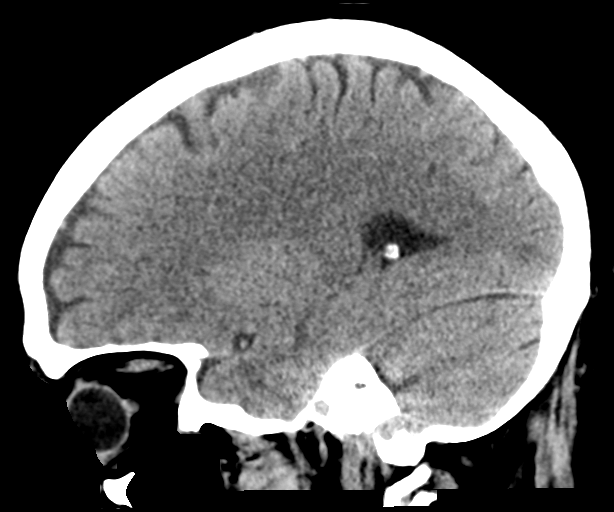
[im 24/48  brain]
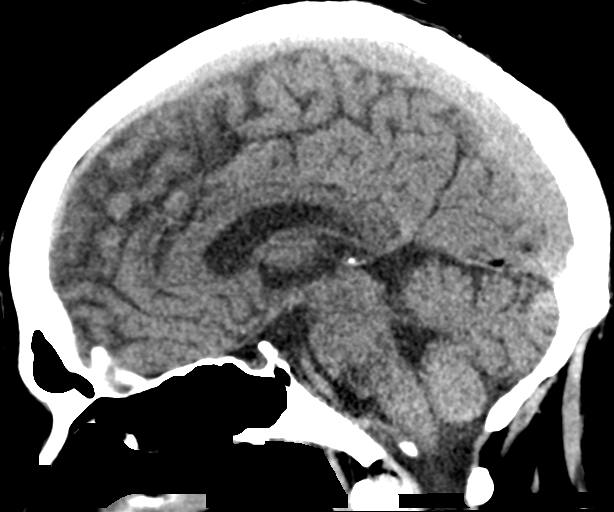
[im 32/48  brain]
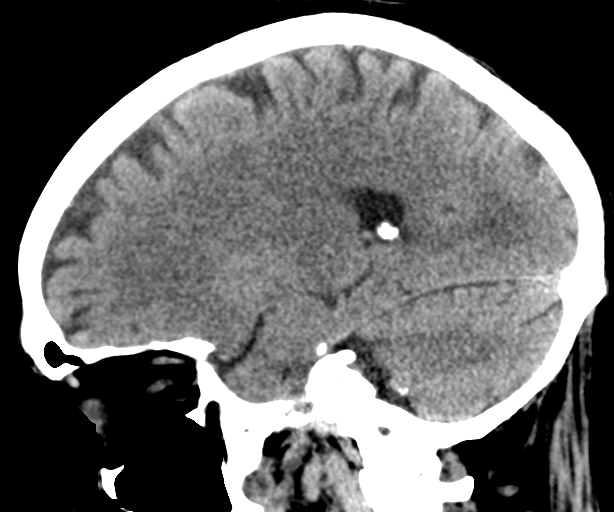

[15 of 47 positions shown; findings below may reference images not displayed]

FINDINGS: Brain: Cerebral volume is stable and within normal limits. No
midline shift, ventriculomegaly, mass effect, evidence of mass
lesion, intracranial hemorrhage or evidence of cortically based
acute infarction. Gray-white matter differentiation is stable
throughout the brain and normal for age.

Vascular: Calcified atherosclerosis at the skull base. No suspicious
intracranial vascular hyperdensity.

Skull: Stable and intact.  No acute osseous abnormality identified.

Sinuses/Orbits: Visualized paranasal sinuses and mastoids are stable
and well pneumatized.

Other: The large left posterior convexity scalp hematoma seen in
0263 has resolved with mild residual soft tissue scarring. There may
be a mild superimposed left scalp contusion in the same area today
(series 3, image 49).

No other scalp hematoma. Stable orbits soft tissues. Negative
visualized noncontrast deep soft tissue spaces of the face.
IMPRESSION: 1. Possible mild acute scalp soft tissue injury at the same site as
the large left scalp hematoma seen in 0263. No skull fracture.
2. Stable and normal for age noncontrast CT appearance of the brain.

## 2018-02-28 DIAGNOSIS — R898 Other abnormal findings in specimens from other organs, systems and tissues: Secondary | ICD-10-CM | POA: Diagnosis not present

## 2018-02-28 DIAGNOSIS — Z6822 Body mass index (BMI) 22.0-22.9, adult: Secondary | ICD-10-CM | POA: Diagnosis not present

## 2018-02-28 DIAGNOSIS — M7021 Olecranon bursitis, right elbow: Secondary | ICD-10-CM | POA: Diagnosis not present

## 2018-08-11 DIAGNOSIS — E119 Type 2 diabetes mellitus without complications: Secondary | ICD-10-CM | POA: Diagnosis not present

## 2018-08-11 DIAGNOSIS — D509 Iron deficiency anemia, unspecified: Secondary | ICD-10-CM | POA: Diagnosis not present

## 2018-08-11 DIAGNOSIS — R945 Abnormal results of liver function studies: Secondary | ICD-10-CM | POA: Diagnosis not present

## 2018-08-12 DIAGNOSIS — R945 Abnormal results of liver function studies: Secondary | ICD-10-CM | POA: Diagnosis not present

## 2018-08-12 DIAGNOSIS — E119 Type 2 diabetes mellitus without complications: Secondary | ICD-10-CM | POA: Diagnosis not present

## 2018-08-12 DIAGNOSIS — D509 Iron deficiency anemia, unspecified: Secondary | ICD-10-CM | POA: Diagnosis not present

## 2019-11-30 IMAGING — US US ABDOMEN COMPLETE
1 series · 14 of 25 positions shown · non-contrast
Comparison: 01/20/2010

CLINICAL DATA: Elevated LFTs

EXAM:
ABDOMEN ULTRASOUND COMPLETE

[Series 1: us abdomen complete · 0.19mm/px · 14 of 103 slices shown]
[im 1/103]
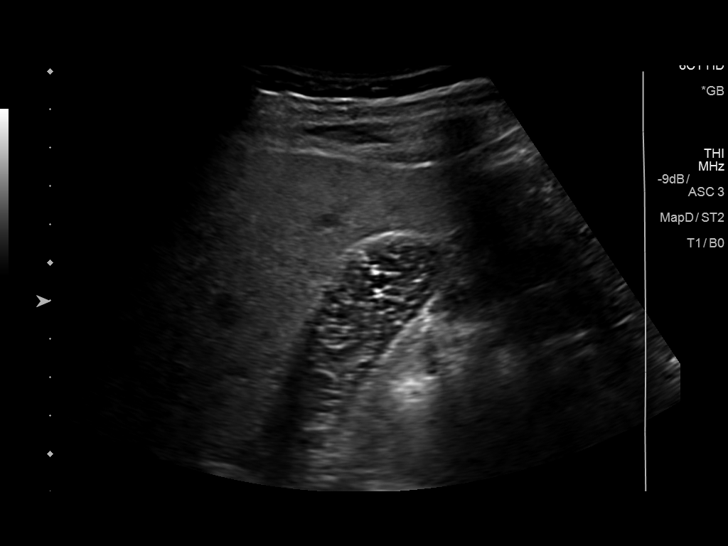
[im 9/103]
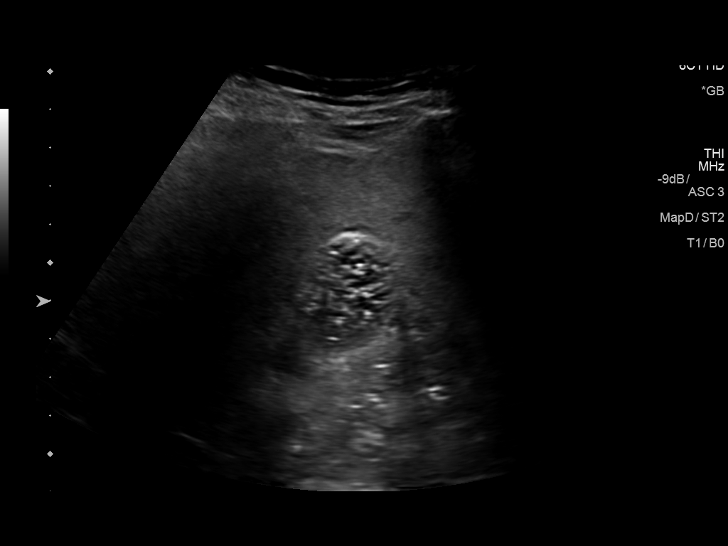
[im 18/103]
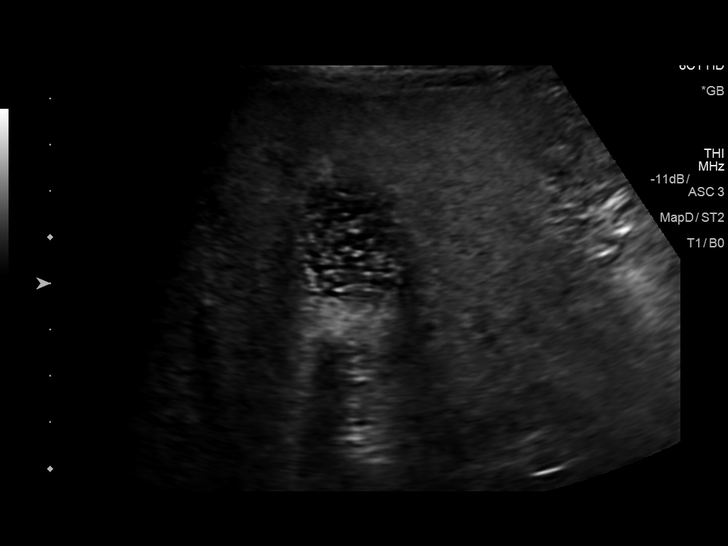
[im 26/103]
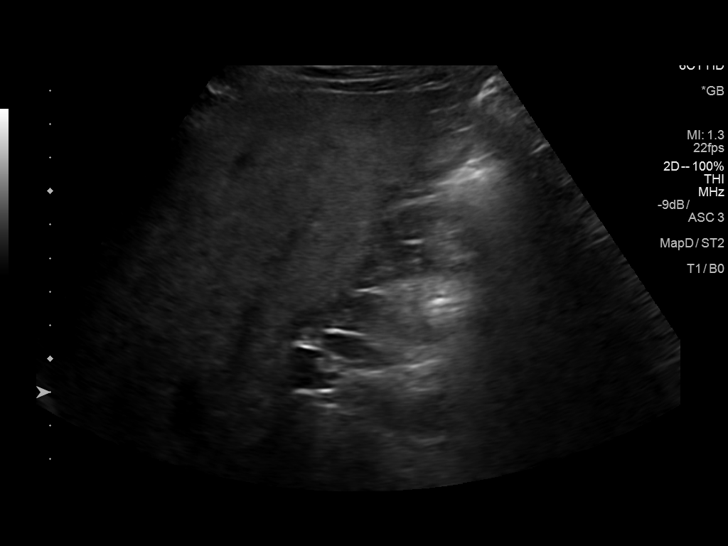
[im 35/103]
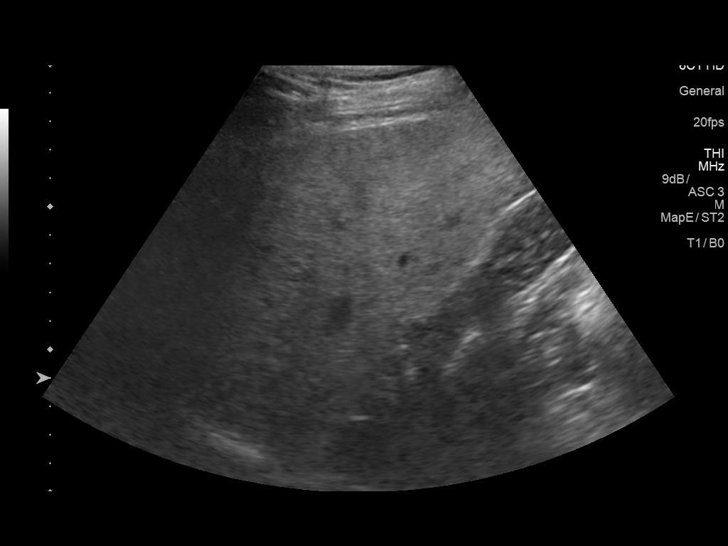
[im 39/103]
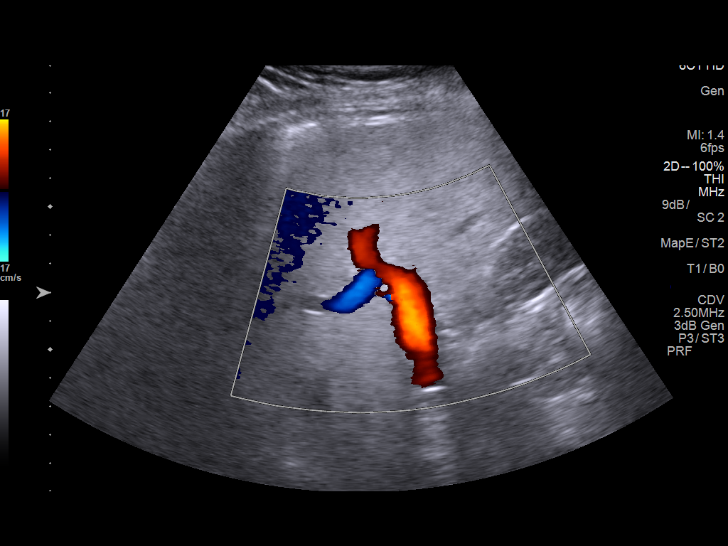
[im 47/103]
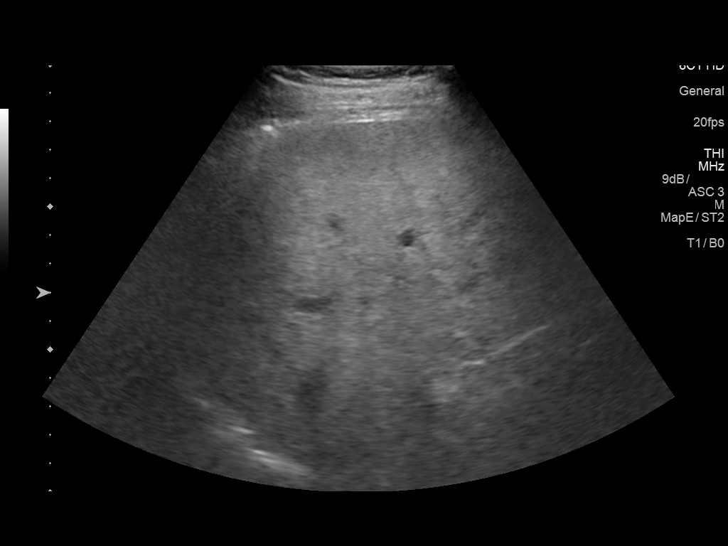
[im 56/103]
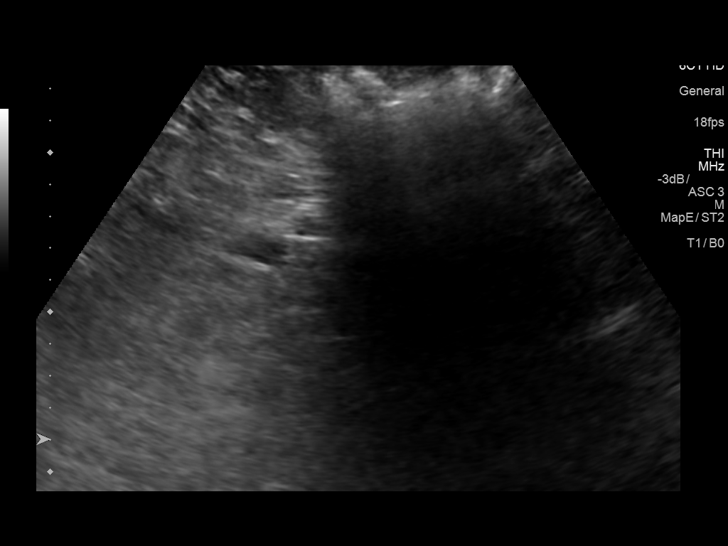
[im 64/103]
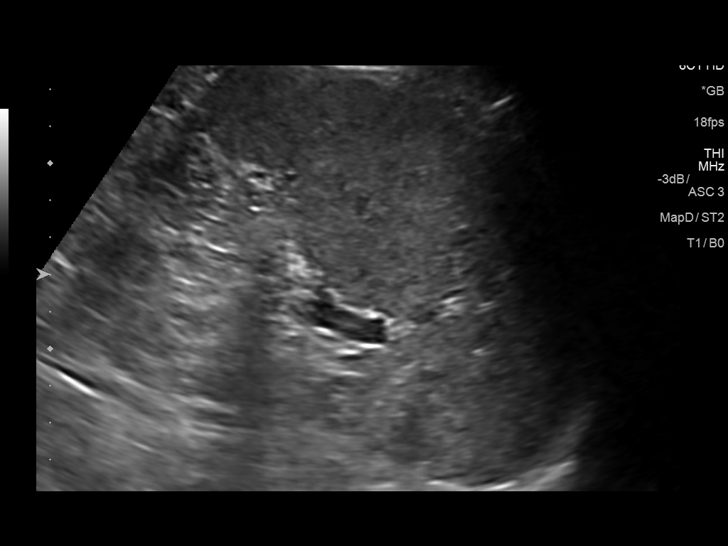
[im 69/103]
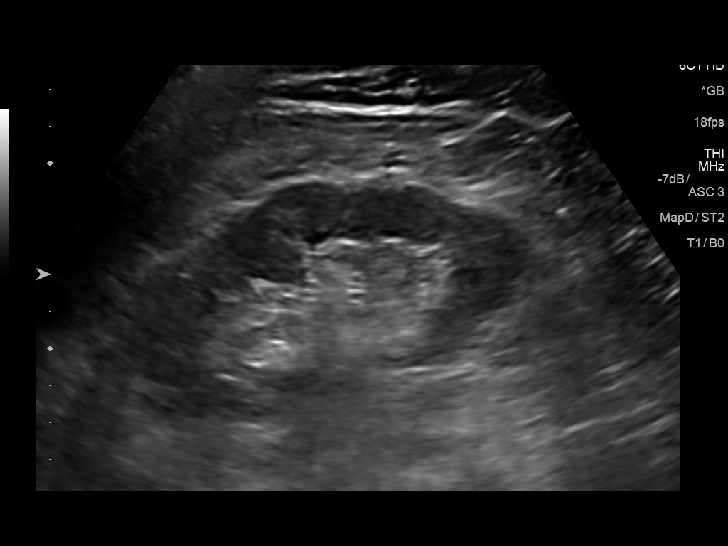
[im 77/103]
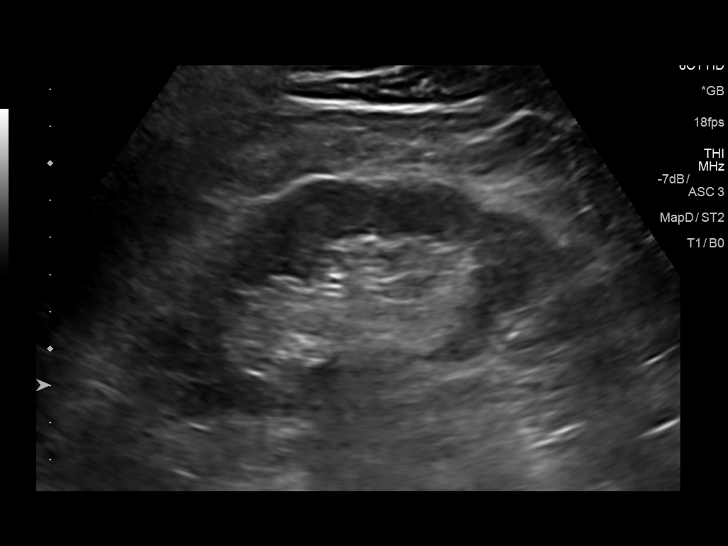
[im 86/103]
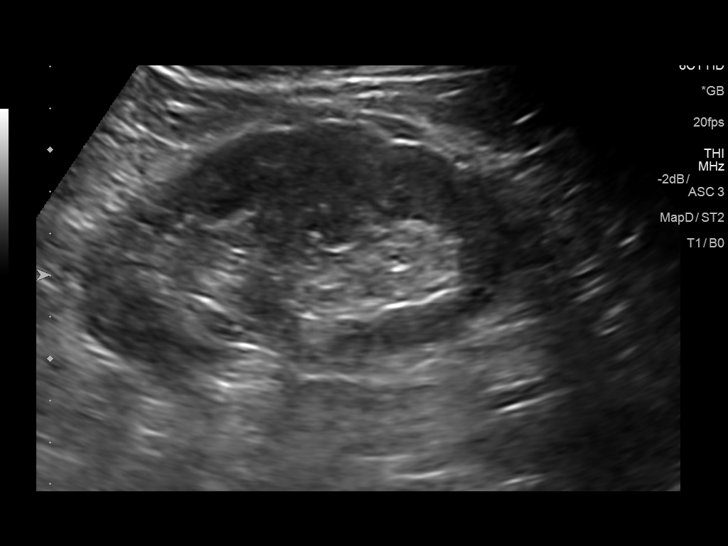
[im 94/103]
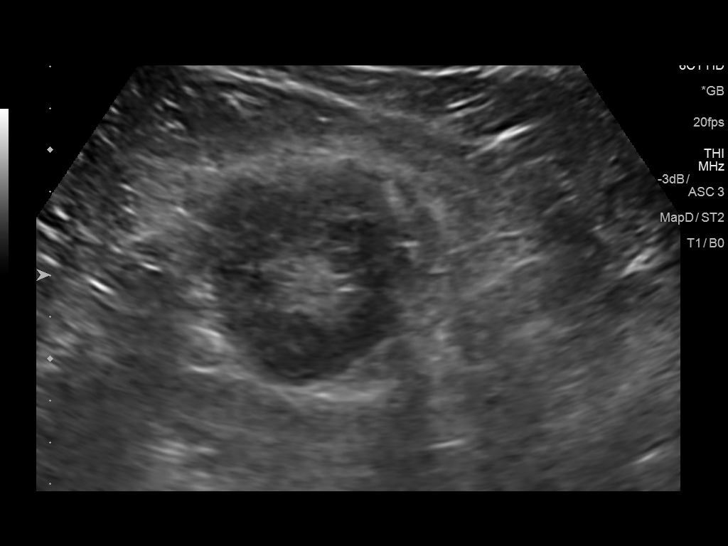
[im 103/103]
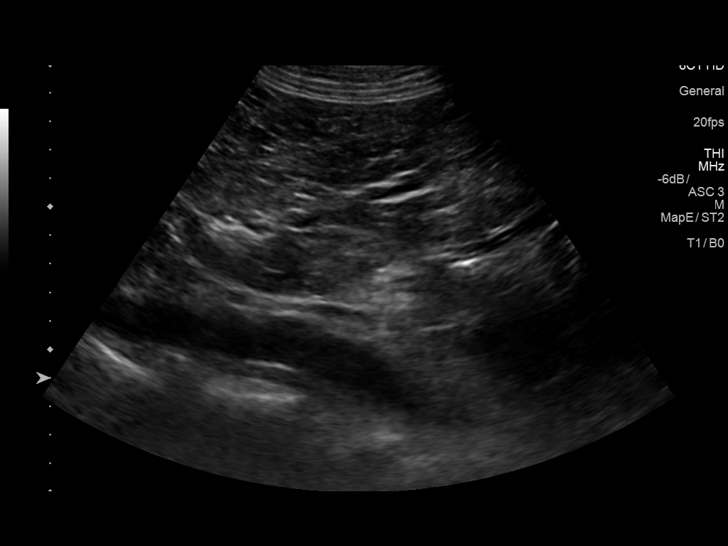

[14 of 25 positions shown; findings below may reference images not displayed]

FINDINGS: Gallbladder: Gallbladder is well distended. No gallbladder wall
thickening or pericholecystic fluid is noted. The gallbladder is
filled with echogenic material consistent with sludge and small
stones.

Common bile duct: Diameter: 6.8 mm. This is within normal limits for
the patient's age.

Liver: Diffusely increased in echogenicity without focal mass.
Portal vein is patent on color Doppler imaging with normal direction
of blood flow towards the liver.

IVC: No abnormality visualized.

Pancreas: Visualized portion unremarkable.

Spleen: Size and appearance within normal limits.

Right Kidney: Length: 11.8 cm. Echogenicity within normal limits. No
mass or hydronephrosis visualized.

Left Kidney: Length: 11.1 cm.. Echogenicity within normal limits. No
mass or hydronephrosis visualized.

Abdominal aorta: No aneurysm visualized.

Other findings: None.
IMPRESSION: Gallbladder sludge and multiple small stones. No ductal dilatation
is seen.

Increased echogenicity within the liver consistent with fatty
infiltration.

## 2021-01-03 DIAGNOSIS — E7849 Other hyperlipidemia: Secondary | ICD-10-CM | POA: Diagnosis not present

## 2021-01-03 DIAGNOSIS — D509 Iron deficiency anemia, unspecified: Secondary | ICD-10-CM | POA: Diagnosis not present

## 2021-01-03 DIAGNOSIS — K760 Fatty (change of) liver, not elsewhere classified: Secondary | ICD-10-CM | POA: Diagnosis not present

## 2021-01-03 DIAGNOSIS — E782 Mixed hyperlipidemia: Secondary | ICD-10-CM | POA: Diagnosis not present

## 2021-01-03 DIAGNOSIS — Z23 Encounter for immunization: Secondary | ICD-10-CM | POA: Diagnosis not present

## 2021-01-03 DIAGNOSIS — M109 Gout, unspecified: Secondary | ICD-10-CM | POA: Diagnosis not present

## 2021-01-03 DIAGNOSIS — R945 Abnormal results of liver function studies: Secondary | ICD-10-CM | POA: Diagnosis not present

## 2021-01-03 DIAGNOSIS — E1140 Type 2 diabetes mellitus with diabetic neuropathy, unspecified: Secondary | ICD-10-CM | POA: Diagnosis not present

## 2021-01-03 DIAGNOSIS — I10 Essential (primary) hypertension: Secondary | ICD-10-CM | POA: Diagnosis not present

## 2021-01-03 DIAGNOSIS — E663 Overweight: Secondary | ICD-10-CM | POA: Diagnosis not present

## 2021-01-03 DIAGNOSIS — Z6829 Body mass index (BMI) 29.0-29.9, adult: Secondary | ICD-10-CM | POA: Diagnosis not present

## 2021-01-03 DIAGNOSIS — D696 Thrombocytopenia, unspecified: Secondary | ICD-10-CM | POA: Diagnosis not present

## 2021-05-01 ENCOUNTER — Encounter (HOSPITAL_COMMUNITY): Payer: Self-pay

## 2021-05-01 ENCOUNTER — Emergency Department (HOSPITAL_COMMUNITY)
Admission: EM | Admit: 2021-05-01 | Discharge: 2021-05-02 | Disposition: A | Discharge status: Home / Self Care | Payer: BC Managed Care – PPO | Attending: Emergency Medicine | Admitting: Emergency Medicine

## 2021-05-01 ENCOUNTER — Emergency Department (HOSPITAL_COMMUNITY): Payer: BC Managed Care – PPO

## 2021-05-01 ENCOUNTER — Ambulatory Visit: Payer: Self-pay | Admitting: *Deleted

## 2021-05-01 DIAGNOSIS — Z7984 Long term (current) use of oral hypoglycemic drugs: Secondary | ICD-10-CM | POA: Diagnosis not present

## 2021-05-01 DIAGNOSIS — I10 Essential (primary) hypertension: Secondary | ICD-10-CM

## 2021-05-01 DIAGNOSIS — Z79899 Other long term (current) drug therapy: Secondary | ICD-10-CM | POA: Insufficient documentation

## 2021-05-01 DIAGNOSIS — R519 Headache, unspecified: Secondary | ICD-10-CM

## 2021-05-01 DIAGNOSIS — R7989 Other specified abnormal findings of blood chemistry: Secondary | ICD-10-CM | POA: Diagnosis not present

## 2021-05-01 DIAGNOSIS — Z20822 Contact with and (suspected) exposure to covid-19: Secondary | ICD-10-CM | POA: Insufficient documentation

## 2021-05-01 DIAGNOSIS — E119 Type 2 diabetes mellitus without complications: Secondary | ICD-10-CM | POA: Insufficient documentation

## 2021-05-01 DIAGNOSIS — H53141 Visual discomfort, right eye: Secondary | ICD-10-CM | POA: Diagnosis not present

## 2021-05-01 DIAGNOSIS — H5789 Other specified disorders of eye and adnexa: Secondary | ICD-10-CM

## 2021-05-01 DIAGNOSIS — R778 Other specified abnormalities of plasma proteins: Secondary | ICD-10-CM

## 2021-05-01 LAB — CBC WITH DIFFERENTIAL/PLATELET
Abs Immature Granulocytes: 0.04 10*3/uL (ref 0.00–0.07)
Basophils Absolute: 0.1 10*3/uL (ref 0.0–0.1)
Basophils Relative: 1 %
Eosinophils Absolute: 0.3 10*3/uL (ref 0.0–0.5)
Eosinophils Relative: 4 %
HCT: 46.1 % (ref 39.0–52.0)
Hemoglobin: 14.1 g/dL (ref 13.0–17.0)
Immature Granulocytes: 1 %
Lymphocytes Relative: 16 %
Lymphs Abs: 1.3 10*3/uL (ref 0.7–4.0)
MCH: 24.1 pg — ABNORMAL LOW (ref 26.0–34.0)
MCHC: 30.6 g/dL (ref 30.0–36.0)
MCV: 78.9 fL — ABNORMAL LOW (ref 80.0–100.0)
Monocytes Absolute: 0.7 10*3/uL (ref 0.1–1.0)
Monocytes Relative: 8 %
Neutro Abs: 6 10*3/uL (ref 1.7–7.7)
Neutrophils Relative %: 70 %
Platelets: 247 10*3/uL (ref 150–400)
RBC: 5.84 MIL/uL — ABNORMAL HIGH (ref 4.22–5.81)
RDW: 16.3 % — ABNORMAL HIGH (ref 11.5–15.5)
WBC: 8.4 10*3/uL (ref 4.0–10.5)
nRBC: 0 % (ref 0.0–0.2)

## 2021-05-01 LAB — RESP PANEL BY RT-PCR (FLU A&B, COVID) ARPGX2
Influenza A by PCR: NEGATIVE
Influenza B by PCR: NEGATIVE
SARS Coronavirus 2 by RT PCR: NEGATIVE

## 2021-05-01 LAB — TROPONIN I (HIGH SENSITIVITY): Troponin I (High Sensitivity): 33 ng/L — ABNORMAL HIGH (ref <18)

## 2021-05-01 LAB — BASIC METABOLIC PANEL
Anion gap: 11 (ref 5–15)
BUN: 10 mg/dL (ref 8–23)
CO2: 25 mmol/L (ref 22–32)
Calcium: 9.3 mg/dL (ref 8.9–10.3)
Chloride: 101 mmol/L (ref 98–111)
Creatinine, Ser: 0.95 mg/dL (ref 0.61–1.24)
GFR, Estimated: >60 mL/min (ref >60)
Glucose, Bld: 126 mg/dL — ABNORMAL HIGH (ref 70–99)
Potassium: 3.9 mmol/L (ref 3.5–5.1)
Sodium: 137 mmol/L (ref 135–145)

## 2021-05-01 NOTE — Telephone Encounter (Signed)
Blood pressure running high.  Chief Complaint: htn Symptoms: headaache Frequency: all day Pertinent Negatives: Patient denies headache now, took tylenol Disposition: [x] ED /[x] Urgent Care (no appt availability in office) / [] Appointment(In office/virtual)/ []  [facility]/ [] Home Care/ [] Refused Recommended Disposition /[] [facility]/ []  Follow-up with PCP Additional Notes: pt given option of UC or ED and chose ED in case UC sent to ED then he would have two bills. Pt was already in route to [facility] ED when he called.  Reason for Disposition  [9] Systolic BP  >= 233 OR Diastolic >= 007 AND [6] cardiac or neurologic symptoms (e.g., chest pain, difficulty breathing, unsteady gait, blurred vision)  Answer Assessment - Initial Assessment Questions 1. BLOOD PRESSURE: "What is the blood pressure?" "Did you take at least two measurements 5 minutes apart?"     174/118 2. ONSET: "When did you take your blood pressure?"     20 min ago 3. HOW: "How did you obtain the blood pressure?" (e.g., visiting nurse, automatic home BP monitor)     Manual cuff 4. HISTORY: "Do you have a history of high blood pressure?"     yes 5. MEDICATIONS: "Are you taking any medications for blood pressure?" "Have you missed any doses recently?"     yes 6. OTHER SYMPTOMS: "Do you have any symptoms?" (e.g., headache, chest pain, blurred vision, difficulty breathing, weakness)     headache 7. PREGNANCY: "Is there any chance you are pregnant?" "When was your last menstrual period?"     na  Protocols used: Blood Pressure - High-A-AH

## 2021-05-01 NOTE — ED Provider Triage Note (Signed)
Emergency Medicine Provider Triage Evaluation Note  Calvin Green , a 66 y.o. male  was evaluated in triage.  Pt complains of feeling off.  He reports he is compliant with his blood pressure meds.    This started today with the feeling off.   He reports feeling off today and his wife reports that he looked bad all day.  He reports that his head started hurting mid day. 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  He does feel like the eye is dry which is new.   No chest pain, back pain, cough or shortness of breath  Physical Exam  BP (!) 183/113 (BP Location: Right Arm)    Pulse 73    Temp 98.5 F (36.9 C)    Resp 18    Ht 5\' 11"  (1.803 m)    Wt 102 kg    SpO2 99%    BMI 31.36 kg/m  Gen:   Awake, no distress   Resp:  Normal effort  MSK:   Moves extremities without difficulty  Other:  Normal speech.   Left eye moves different than right.   Medical Decision Making  Medically screening exam initiated at 9:15 PM.  Appropriate orders placed.  Claudis Giovanelli Krehbiel was informed that the remainder of the evaluation will be completed by another provider, this initial triage assessment does not replace that evaluation, and the importance of remaining in the ED until their evaluation is complete.  With his hypertension due to unknown cause we will check labs.  Additionally will check EKG, and with his headache will check CT head.   Lorin Glass, [place] 05/01/21 2124

## 2021-05-01 NOTE — ED Triage Notes (Signed)
Pt arrives POV for eval of hypertension and headache. Pt reports he felt off earlier and checked his BP, which he found to be elevated. Pt reports hx of HTN, compliance w/ medication. Pt reports he took some tylenol for his HA but still present. Hypertensive on arrival

## 2021-05-02 LAB — TROPONIN I (HIGH SENSITIVITY): Troponin I (High Sensitivity): 32 ng/L — ABNORMAL HIGH (ref <18)

## 2021-05-02 MED ORDER — FLUORESCEIN SODIUM 1 MG OP STRP
1.0000 | ORAL_STRIP | Freq: Once | OPHTHALMIC | Status: AC
  Administered 2021-05-02: 1 via OPHTHALMIC
  Filled 2021-05-02: qty 1

## 2021-05-02 NOTE — ED Provider Notes (Signed)
[facility] EMERGENCY DEPARTMENT Provider Note   CSN: [id number] Arrival date & time: 05/01/21  1847     History  Chief Complaint  Patient presents with   Hypertension    Calvin Green is a 66 y.o. male.  The history is provided by the patient and a relative.  Hypertension He has history of hypertension, diabetes, gout, iron deficiency anemia, blindness in the right eye, and comes in because his blood pressure is elevated.  He had a mild headache today which did go away after taking acetaminophen.  However, he is worried that his blood pressure might be up so he had it checked and it was 176/118.  He denies any tinnitus or epistaxis.  He denies chest pain, heaviness, tightness, pressure.  He denies any nausea or vomiting.  He denies any weakness or numbness other than chronic weakness he has with his right arm.  He has been compliant with his medications.  He does not check his blood pressure on a regular basis, only when he feels bad.  He does state that the last time he saw his primary care provider, his blood pressure was normal.   Home Medications Prior to Admission medications   Medication Sig Start Date End Date Taking? Authorizing Provider  acetaminophen (TYLENOL) 650 MG CR tablet Take 650 mg by mouth every 8 (eight) hours as needed for pain.   Yes [provider]  Esomeprazole Magnesium (NEXIUM 24HR PO) Take 1 capsule by mouth at bedtime.   Yes [provider]  IRON PO Take 1 tablet by mouth daily. [employer] Blood Builder   Yes [provider]  metFORMIN (GLUCOPHAGE) 1000 MG tablet Take 1,000 mg by mouth in the morning and at bedtime. 03/31/21  Yes [provider]  olmesartan (BENICAR) 40 MG tablet Take 40 mg by mouth daily. 03/31/21  Yes [provider]  Probiotic Product (ALIGN PO) Take 1 capsule by mouth at bedtime.   Yes [provider]      Allergies    Patient has no known allergies.    Review of  Systems   Review of Systems  All other systems reviewed and are negative.  Physical Exam Updated Vital Signs BP (!) 162/112    Pulse 77    Temp 98.6 F (37 C) (Oral)    Resp 18    Ht 5\' 11"  (1.803 m)    Wt 102 kg    SpO2 99%    BMI 31.36 kg/m  Physical Exam Vitals and nursing note reviewed.  66 year old male, resting comfortably and in no acute distress. Vital signs are significant for elevated blood pressure. Oxygen saturation is 97%, which is normal. Head is normocephalic and atraumatic. EOMI. Oropharynx is clear.  No evidence of any corneal injury.  Anterior chambers clear.  Upper and lower conjunctival sacs were inspected after eversion of lids and no foreign body was seen, no evidence of ingrown hair.  Right eye was stained with fluorescein and examined with a Woods lamp and there is no abnormal area of increased uptake. Neck is nontender and supple without adenopathy or JVD. Back is nontender and there is no CVA tenderness. Lungs are clear without rales, wheezes, or rhonchi. Chest is nontender. Heart has regular rate and rhythm without murmur. Abdomen is soft, flat, nontender without masses or hepatosplenomegaly and peristalsis is normoactive. Extremities have no cyanosis or edema, full range of motion is present. Skin is warm and dry without rash. Neurologic: Mental  status is normal, cranial nerves are intact.  There is weakness of the right arm 3/5, and right leg 4/5.  This weakness is reported to be chronic.  ED Results / Procedures / Treatments   Labs (all labs ordered are listed, but only abnormal results are displayed) Labs Reviewed  BASIC METABOLIC PANEL - Abnormal; Notable for the following components:      Result Value   Glucose, Bld 126 (*)    All other components within normal limits  CBC WITH DIFFERENTIAL/PLATELET - Abnormal; Notable for the following components:   RBC 5.84 (*)    MCV 78.9 (*)    MCH 24.1 (*)    RDW 16.3 (*)    All other components within normal  limits  TROPONIN I (HIGH SENSITIVITY) - Abnormal; Notable for the following components:   Troponin I (High Sensitivity) 33 (*)    All other components within normal limits  TROPONIN I (HIGH SENSITIVITY) - Abnormal; Notable for the following components:   Troponin I (High Sensitivity) 32 (*)    All other components within normal limits  RESP PANEL BY RT-PCR (FLU A&B, COVID) [id number]    EKG EKG Interpretation  Date/Time:  Monday May 01 2021 21:31:45 EST Ventricular Rate:  84 PR Interval:  106 QRS Duration: 104 QT Interval:  396 QTC Calculation: 467 R Axis:   40 Text Interpretation: Sinus rhythm with short PR Left ventricular hypertrophy with repolarization abnormality ( Cornell product ) Abnormal ECG No previous ECGs available Confirmed by Delora Fuel ([id number]) on 05/01/2021 11:45:55 PM  Radiology CT HEAD WO CONTRAST (5MM)  Result Date: 05/01/2021 CLINICAL DATA:  Head trauma.  Hypertension and headache. EXAM: CT HEAD WITHOUT CONTRAST TECHNIQUE: Contiguous axial images were obtained from the base of the skull through the vertex without intravenous contrast. RADIATION DOSE REDUCTION: This exam was performed according to the departmental dose-optimization program which includes automated exposure control, adjustment of the mA and/or kV according to patient size and/or use of iterative reconstruction technique. COMPARISON:  09/14/2016 FINDINGS: Brain: No evidence of acute infarction, hemorrhage, hydrocephalus, extra-axial collection or mass lesion/mass effect. Mild cerebral atrophy. Vascular: No hyperdense vessel or unexpected calcification. Skull: Calvarium appears intact. Sinuses/Orbits: Paranasal sinuses and mastoid air cells are clear. Other: None. IMPRESSION: No acute intracranial abnormalities.  Mild chronic atrophy. Electronically Signed   By: Lucienne Capers M.D.   On: 05/01/2021 22:19    Procedures Procedures    Medications Ordered in ED Medications  fluorescein ophthalmic strip 1  strip (1 strip Right Eye Given 05/02/21 0236)    ED Course/ Medical Decision Making/ A&P                           Medical Decision Making Risk Prescription drug management.   Headache which appears benign.  Elevated blood pressure in patient with history of hypertension.  ECG was obtained and shows evidence of left ventricular hypertrophy, no prior ECGs available for comparison.  CT of the head was obtained which showed no acute process, mild atrophy.  Images were independently viewed by myself, and I agree with the radiologist's interpretation.  CBC is normal, metabolic panel is normal.  Troponin is mildly elevated at 33, but is not changing with repeat troponin.  Cause for mild elevation of troponin is not clear, but there is no evidence of ACS.  Cause for his right eye irritation is not clear, no evidence of corneal abrasion.  He is referred back to his ophthalmologist  to evaluate his eye.  Blood pressure elevation is present, but not critical.  He is reassured regarding this and told to monitor his blood pressure at home on a daily basis.  Old records are reviewed, and he has no relevant past visits.        Final Clinical Impression(s) / ED Diagnoses Final diagnoses:  Elevated blood pressure reading with diagnosis of hypertension  Irritation of right eye  Acute nonintractable headache, unspecified headache type  Elevated troponin    Rx / DC Orders ED Discharge Orders     None         Delora Fuel, MD 05/16/77 [phone]

## 2021-05-02 NOTE — Discharge Instructions (Signed)
Please check your blood pressure once a day.  Keep a record of it and take that record with you when you see your primary care provider.  Please make an appointment with your eye doctor to evaluate why your eye feels irritated.  In the meantime, it is fine to continue using the lubricating drops as needed.  Return to the emergency department if you have any new or concerning symptoms.

## 2021-10-06 ENCOUNTER — Encounter: Payer: Self-pay | Admitting: Emergency Medicine

## 2021-10-06 ENCOUNTER — Ambulatory Visit
Admission: EM | Admit: 2021-10-06 | Discharge: 2021-10-06 | Disposition: A | Discharge status: Home / Self Care | Payer: BC Managed Care – PPO

## 2021-10-06 DIAGNOSIS — H6123 Impacted cerumen, bilateral: Secondary | ICD-10-CM | POA: Diagnosis not present

## 2021-10-06 MED ORDER — CARBAMIDE PEROXIDE 6.5 % OT SOLN: 5.0000 [drp] | Freq: Two times a day (BID) | OTIC | 0 refills | Status: DC

## 2021-10-06 NOTE — ED Provider Notes (Signed)
[facility]    CSN: [id number] Arrival date & time: 10/06/21  0801      History   Chief Complaint No chief complaint on file.   HPI Calvin Green is a 66 y.o. male.   Presenting today with right ear fullness, muffled hearing since Monday.  Denies drainage, sharp ear pain, fever, chills, headaches, nausea, vomiting, dizziness.  So far not trying anything over-the-counter for symptoms.    Past Medical History:  Diagnosis Date   Anemia    Diabetes mellitus without complication ([place])    Fatty liver    Gout    Hypertension    Obesity     Patient Active Problem List   Diagnosis Date Noted   FATTY LIVER DISEASE 01/16/2010   TRANSAMINASES, SERUM, ELEVATED 01/16/2010   TUBULOVILLOUS ADENOMA, COLON 01/12/2010   ANEMIA, IRON DEFICIENCY, CHRONIC 76/28/3151   ALCOHOLIC FATTY LIVER 76/16/0737   CHOLELITHIASIS 01/12/2010   GASTRITIS, HX OF 01/12/2010    History reviewed. No pertinent surgical history.     Home Medications    Prior to Admission medications   Medication Sig Start Date End Date Taking? Authorizing Provider  amLODipine (NORVASC) 2.5 MG tablet Take 2.5 mg by mouth daily.   Yes [provider]  carbamide peroxide (DEBROX) 6.5 % OTIC solution Place 5 drops into both ears 2 (two) times daily. 10/06/21  Yes Volney American, PA-C  acetaminophen (TYLENOL) 650 MG CR tablet Take 650 mg by mouth every 8 (eight) hours as needed for pain.    [provider]  Esomeprazole Magnesium (NEXIUM 24HR PO) Take 1 capsule by mouth at bedtime.    [provider]  IRON PO Take 1 tablet by mouth daily. [employer] Blood Builder    [provider]  metFORMIN (GLUCOPHAGE) 1000 MG tablet Take 1,000 mg by mouth in the morning and at bedtime. 03/31/21   [provider]  olmesartan (BENICAR) 40 MG tablet Take 40 mg by mouth daily. 03/31/21   [provider]  Probiotic Product (ALIGN PO) Take 1 capsule by mouth at bedtime.     [provider]    Family History Family History  Problem Relation Age of Onset   Hypertension Other    Diabetes Other    Gout Other     Social History Social History   Tobacco Use   Smoking status: Never   Smokeless tobacco: Current    Types: Chew  Substance Use Topics   Alcohol use: Yes    Comment: occas   Drug use: No     Allergies   Patient has no known allergies.   Review of Systems Review of Systems Per HPI  Physical Exam Triage Vital Signs ED Triage Vitals  Enc Vitals Group     BP 10/06/21 0808 (!) 170/91     Pulse Rate 10/06/21 0808 95     Resp 10/06/21 0808 18     Temp 10/06/21 0808 98.5 F (36.9 C)     Temp Source 10/06/21 0808 Oral     SpO2 10/06/21 0808 97 %     Weight --      Height --      Head Circumference --      Peak Flow --      Pain Score 10/06/21 0809 0     Pain Loc --      Pain Edu? --      Excl. in [place]? --    No data found.  Updated Vital  Signs BP (!) 170/91 (BP Location: Right Arm)   Pulse 95   Temp 98.5 F (36.9 C) (Oral)   Resp 18   SpO2 97%   Visual Acuity Right Eye Distance:   Left Eye Distance:   Bilateral Distance:    Right Eye Near:   Left Eye Near:    Bilateral Near:     Physical Exam Vitals and nursing note reviewed.  Constitutional:      Appearance: Normal appearance.  HENT:     Head: Atraumatic.     Right Ear: There is impacted cerumen.     Left Ear: There is impacted cerumen.  Eyes:     Extraocular Movements: Extraocular movements intact.     Conjunctiva/sclera: Conjunctivae normal.  Cardiovascular:     Rate and Rhythm: Normal rate and regular rhythm.  Pulmonary:     Effort: Pulmonary effort is normal.     Breath sounds: Normal breath sounds.  Musculoskeletal:        General: Normal range of motion.     Cervical back: Normal range of motion and neck supple.  Skin:    General: Skin is warm and dry.  Neurological:     General: No focal deficit present.     Mental Status: He is  oriented to person, place, and time.  Psychiatric:        Mood and Affect: Mood normal.        Thought Content: Thought content normal.        Judgment: Judgment normal.      UC Treatments / Results  Labs (all labs ordered are listed, but only abnormal results are displayed) Labs Reviewed - No data to display  EKG   Radiology No results found.  Procedures Procedures (including critical care time)  Medications Ordered in UC Medications - No data to display  Initial Impression / Assessment and Plan / UC Course  I have reviewed the triage vital signs and the nursing notes.  Pertinent labs & imaging results that were available during my care of the patient were reviewed by me and considered in my medical decision making (see chart for details).     Numerous attempts at lavage with warm water and peroxide were performed today with minimal clearance of wax impactions.  Discussed need for Debrox drops several times daily, warm shower water and returning for another attempt at rinsing after about a week of this if not fully cleared.  Final Clinical Impressions(s) / UC Diagnoses   Final diagnoses:  Bilateral impacted cerumen   Discharge Instructions   None    ED Prescriptions     Medication Sig Dispense Auth. Provider   carbamide peroxide (DEBROX) 6.5 % OTIC solution Place 5 drops into both ears 2 (two) times daily. 15 mL Volney American, [place]      PDMP not reviewed this encounter.   Merrie Roof [place], [place] 10/06/21 [phone]

## 2021-10-06 NOTE — ED Triage Notes (Signed)
Right ear feels full since Monday

## 2022-06-09 IMAGING — CT CT HEAD W/O CM
4 series · 16 of 47 positions shown, 18 images · non-contrast
Comparison: 09/14/2016

CLINICAL DATA: Head trauma.  Hypertension and headache.



[Series 3: head wo · axial · 0.45mm/px · z∈[-182,-52]mm · 7 of 36 slices shown, 9 images]
[im 5/36  brain]
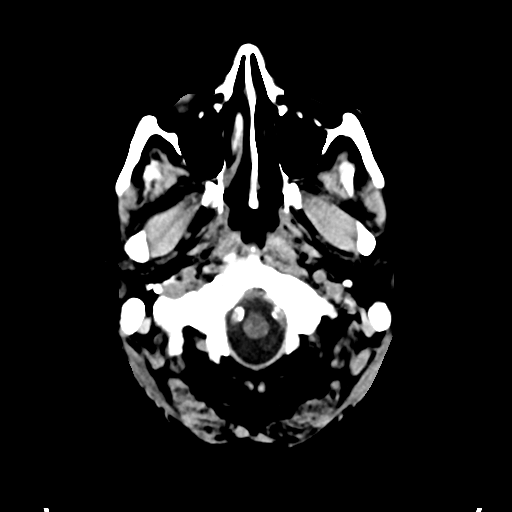
[im 5/36  bone]
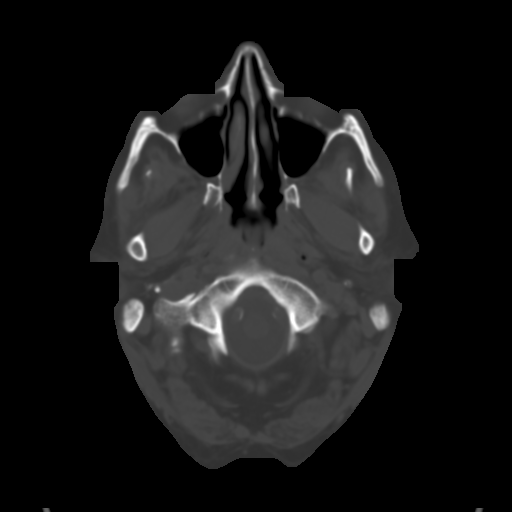
[im 9/36  brain]
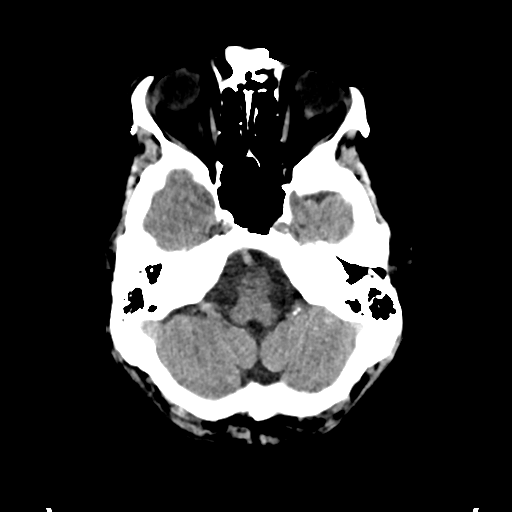
[im 14/36  brain]
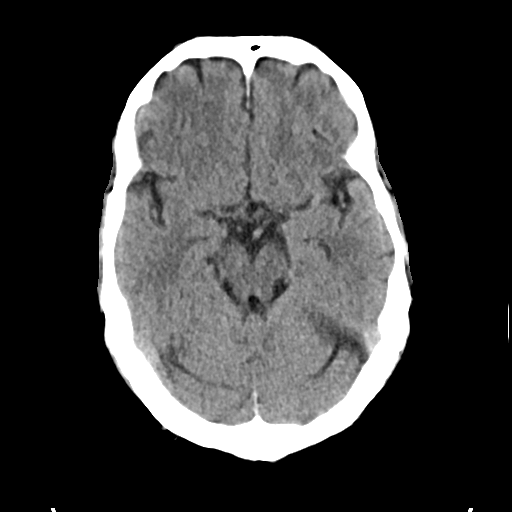
[im 18/36  brain]
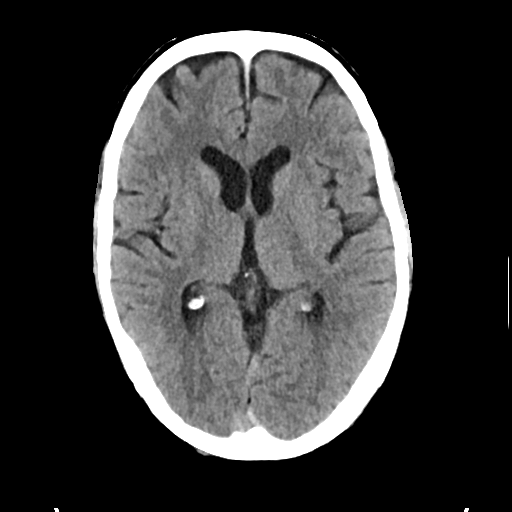
[im 22/36  brain]
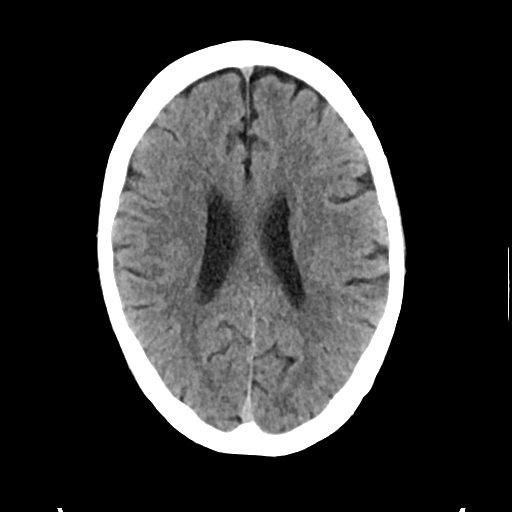
[im 22/36  bone]
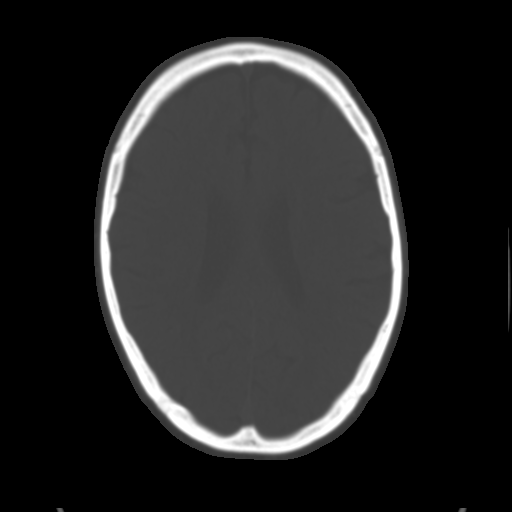
[im 27/36  brain]
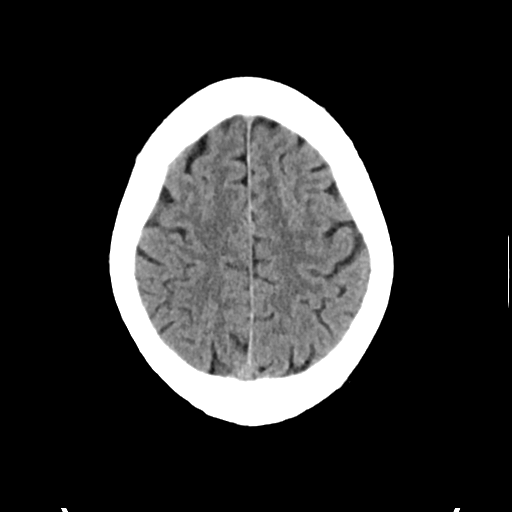
[im 31/36  brain]
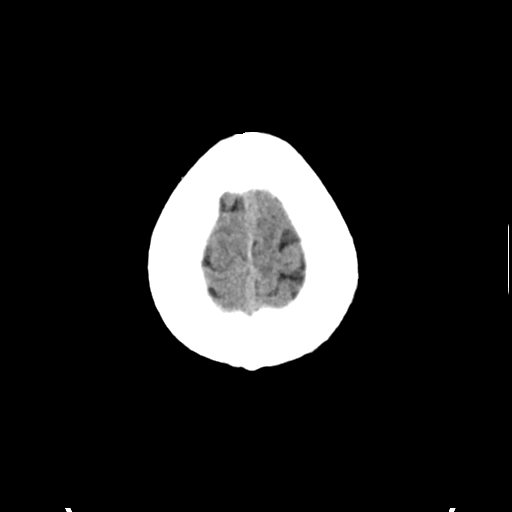

[Series 4: head bone · axial · 0.45mm/px · z∈[-186,-150]mm · 3 of 90 slices shown]
[im 9/90  bone]
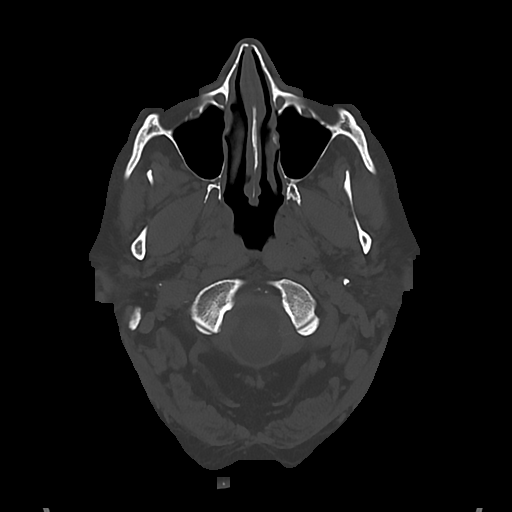
[im 18/90  bone]
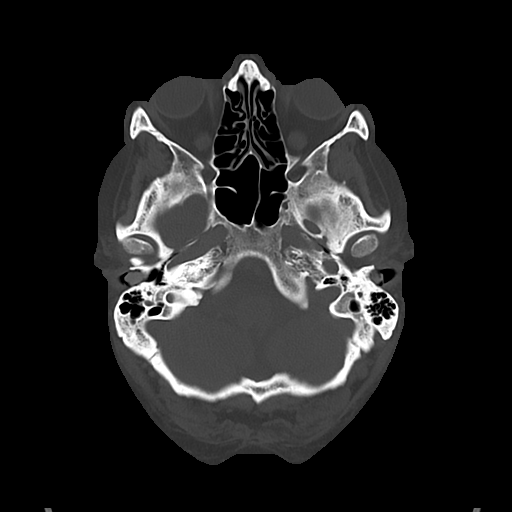
[im 27/90  bone]
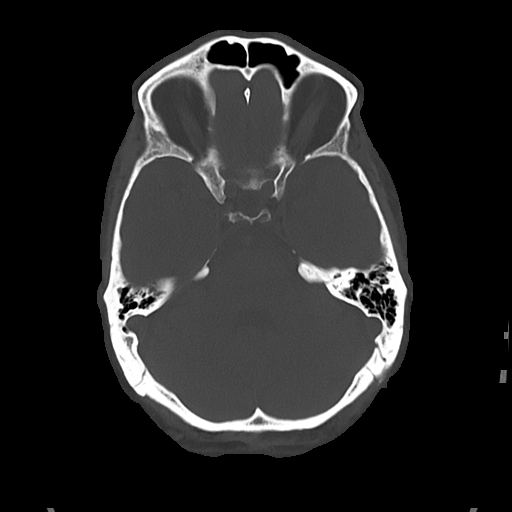

[Series 5: cor soft · coronal · 0.38mm/px · 3 of 73 slices shown]
[im 25/73  brain]
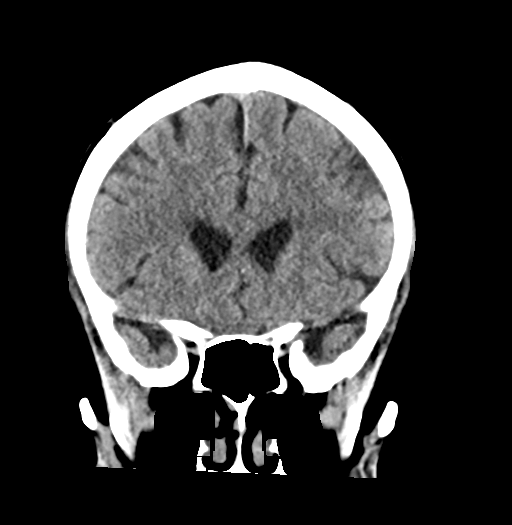
[im 33/73  brain]
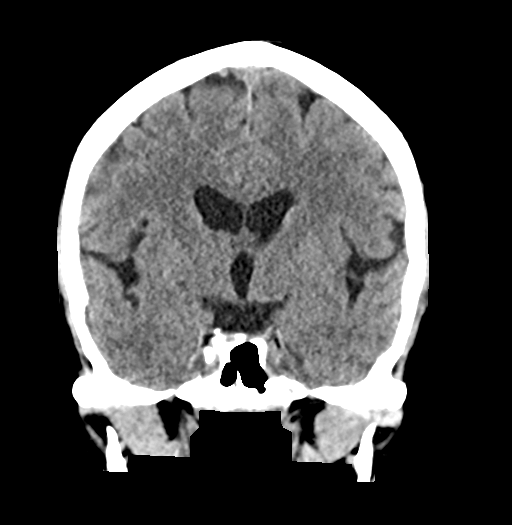
[im 41/73  brain]
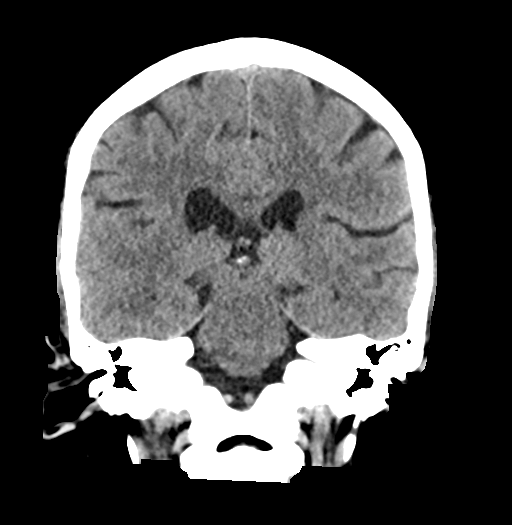

[Series 6: sag soft · sagittal · 0.36mm/px · 3 of 65 slices shown]
[im 22/65  brain]
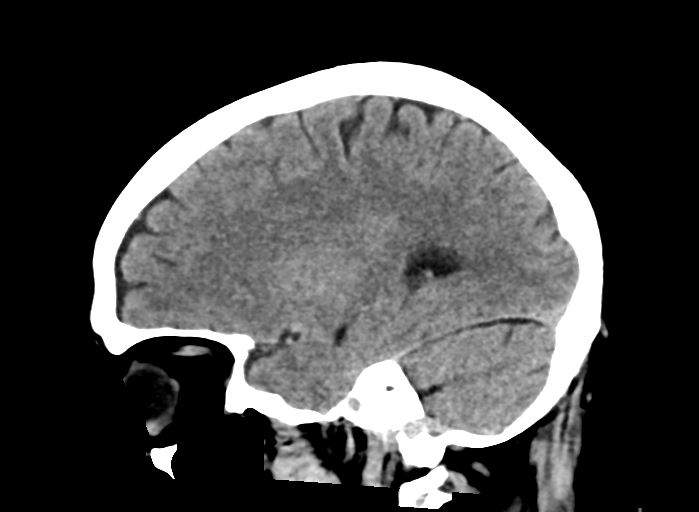
[im 33/65  brain]
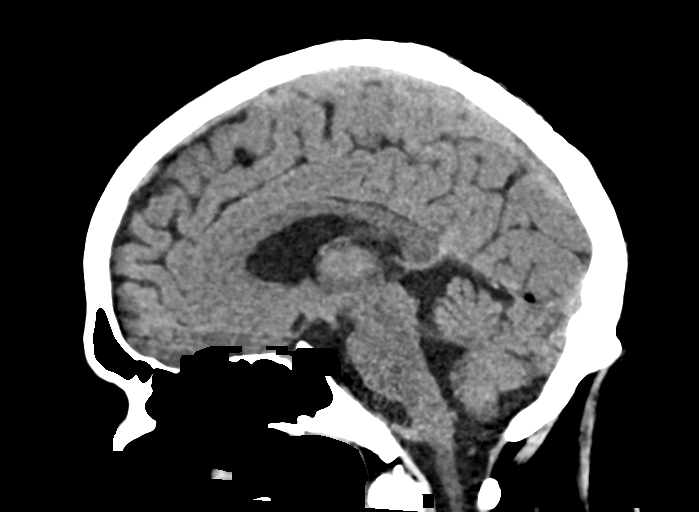
[im 43/65  brain]
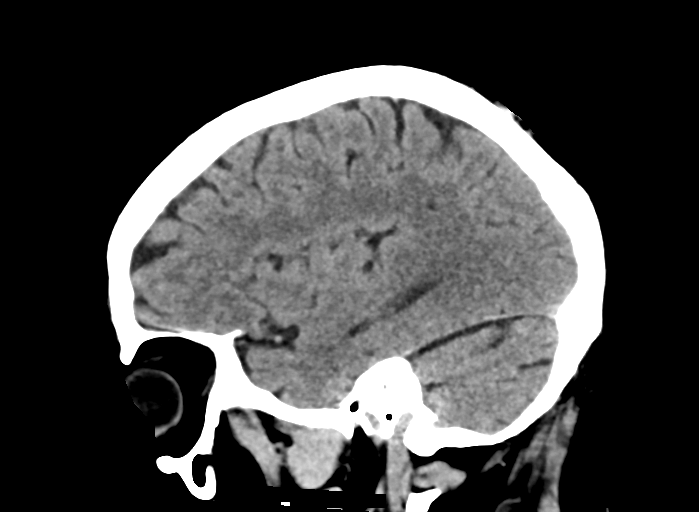

[16 of 47 positions shown; findings below may reference images not displayed]

FINDINGS: Brain: No evidence of acute infarction, hemorrhage, hydrocephalus,
extra-axial collection or mass lesion/mass effect. Mild cerebral
atrophy.

Vascular: No hyperdense vessel or unexpected calcification.

Skull: Calvarium appears intact.

Sinuses/Orbits: Paranasal sinuses and mastoid air cells are clear.

Other: None.
IMPRESSION: No acute intracranial abnormalities.  Mild chronic atrophy.

## 2023-07-08 ENCOUNTER — Ambulatory Visit (HOSPITAL_COMMUNITY)
Admission: RE | Admit: 2023-07-08 | Discharge: 2023-07-08 | Disposition: A | Discharge status: Home / Self Care | Source: Ambulatory Visit | Attending: Internal Medicine | Admitting: Internal Medicine

## 2023-07-08 ENCOUNTER — Other Ambulatory Visit (HOSPITAL_COMMUNITY): Payer: Self-pay | Admitting: Internal Medicine

## 2023-07-08 DIAGNOSIS — R0600 Dyspnea, unspecified: Secondary | ICD-10-CM

## 2023-07-14 ENCOUNTER — Emergency Department (HOSPITAL_COMMUNITY)

## 2023-07-14 ENCOUNTER — Other Ambulatory Visit: Payer: Self-pay

## 2023-07-14 ENCOUNTER — Encounter (HOSPITAL_COMMUNITY): Payer: Self-pay

## 2023-07-14 ENCOUNTER — Observation Stay (HOSPITAL_COMMUNITY)
Admission: EM | Admit: 2023-07-14 | Discharge: 2023-07-16 | Disposition: A | Discharge status: Home / Self Care | Attending: Internal Medicine | Admitting: Internal Medicine

## 2023-07-14 DIAGNOSIS — D509 Iron deficiency anemia, unspecified: Principal | ICD-10-CM | POA: Insufficient documentation

## 2023-07-14 DIAGNOSIS — Z79899 Other long term (current) drug therapy: Secondary | ICD-10-CM | POA: Diagnosis not present

## 2023-07-14 DIAGNOSIS — I10 Essential (primary) hypertension: Secondary | ICD-10-CM | POA: Diagnosis not present

## 2023-07-14 DIAGNOSIS — K922 Gastrointestinal hemorrhage, unspecified: Secondary | ICD-10-CM | POA: Insufficient documentation

## 2023-07-14 DIAGNOSIS — K648 Other hemorrhoids: Secondary | ICD-10-CM | POA: Diagnosis not present

## 2023-07-14 DIAGNOSIS — R7989 Other specified abnormal findings of blood chemistry: Secondary | ICD-10-CM | POA: Diagnosis present

## 2023-07-14 DIAGNOSIS — K2970 Gastritis, unspecified, without bleeding: Secondary | ICD-10-CM | POA: Diagnosis present

## 2023-07-14 DIAGNOSIS — K921 Melena: Secondary | ICD-10-CM | POA: Diagnosis not present

## 2023-07-14 DIAGNOSIS — K5730 Diverticulosis of large intestine without perforation or abscess without bleeding: Secondary | ICD-10-CM | POA: Insufficient documentation

## 2023-07-14 DIAGNOSIS — D124 Benign neoplasm of descending colon: Secondary | ICD-10-CM | POA: Diagnosis not present

## 2023-07-14 DIAGNOSIS — D649 Anemia, unspecified: Principal | ICD-10-CM | POA: Diagnosis present

## 2023-07-14 DIAGNOSIS — K317 Polyp of stomach and duodenum: Secondary | ICD-10-CM | POA: Insufficient documentation

## 2023-07-14 DIAGNOSIS — E119 Type 2 diabetes mellitus without complications: Secondary | ICD-10-CM | POA: Diagnosis not present

## 2023-07-14 DIAGNOSIS — E039 Hypothyroidism, unspecified: Secondary | ICD-10-CM | POA: Diagnosis not present

## 2023-07-14 DIAGNOSIS — M109 Gout, unspecified: Secondary | ICD-10-CM | POA: Diagnosis not present

## 2023-07-14 DIAGNOSIS — R600 Localized edema: Secondary | ICD-10-CM | POA: Diagnosis present

## 2023-07-14 DIAGNOSIS — I5042 Chronic combined systolic (congestive) and diastolic (congestive) heart failure: Secondary | ICD-10-CM

## 2023-07-14 LAB — COMPREHENSIVE METABOLIC PANEL WITH GFR
ALT: 18 U/L (ref 0–44)
AST: 18 U/L (ref 15–41)
Albumin: 3.2 g/dL — ABNORMAL LOW (ref 3.5–5.0)
Alkaline Phosphatase: 84 U/L (ref 38–126)
Anion gap: 10 (ref 5–15)
BUN: 12 mg/dL (ref 8–23)
CO2: 22 mmol/L (ref 22–32)
Calcium: 8.6 mg/dL — ABNORMAL LOW (ref 8.9–10.3)
Chloride: 105 mmol/L (ref 98–111)
Creatinine, Ser: 1.05 mg/dL (ref 0.61–1.24)
GFR, Estimated: >60 mL/min (ref >60)
Glucose, Bld: 102 mg/dL — ABNORMAL HIGH (ref 70–99)
Potassium: 3.9 mmol/L (ref 3.5–5.1)
Sodium: 137 mmol/L (ref 135–145)
Total Bilirubin: 0.5 mg/dL (ref 0.0–1.2)
Total Protein: 6.6 g/dL (ref 6.5–8.1)

## 2023-07-14 LAB — CBC WITH DIFFERENTIAL/PLATELET
Abs Immature Granulocytes: 0.04 10*3/uL (ref 0.00–0.07)
Basophils Absolute: 0.1 10*3/uL (ref 0.0–0.1)
Basophils Relative: 1 %
Eosinophils Absolute: 0.1 10*3/uL (ref 0.0–0.5)
Eosinophils Relative: 1 %
HCT: 24.2 % — ABNORMAL LOW (ref 39.0–52.0)
Hemoglobin: 5.7 g/dL — CL (ref 13.0–17.0)
Immature Granulocytes: 1 %
Lymphocytes Relative: 14 %
Lymphs Abs: 1.1 10*3/uL (ref 0.7–4.0)
MCH: 16.3 pg — ABNORMAL LOW (ref 26.0–34.0)
MCHC: 23.6 g/dL — ABNORMAL LOW (ref 30.0–36.0)
MCV: 69.1 fL — ABNORMAL LOW (ref 80.0–100.0)
Monocytes Absolute: 0.7 10*3/uL (ref 0.1–1.0)
Monocytes Relative: 9 %
Neutro Abs: 5.9 10*3/uL (ref 1.7–7.7)
Neutrophils Relative %: 74 %
Platelets: 453 10*3/uL — ABNORMAL HIGH (ref 150–400)
RBC: 3.5 MIL/uL — ABNORMAL LOW (ref 4.22–5.81)
RDW: 24.3 % — ABNORMAL HIGH (ref 11.5–15.5)
WBC: 7.9 10*3/uL (ref 4.0–10.5)
nRBC: 0 % (ref 0.0–0.2)

## 2023-07-14 LAB — BRAIN NATRIURETIC PEPTIDE: B Natriuretic Peptide: 332 pg/mL — ABNORMAL HIGH (ref 0.0–100.0)

## 2023-07-14 LAB — CBG MONITORING, ED: Glucose-Capillary: 118 mg/dL — ABNORMAL HIGH (ref 70–99)

## 2023-07-14 LAB — PROTIME-INR
INR: 1.1 (ref 0.8–1.2)
Prothrombin Time: 14.3 s (ref 11.4–15.2)

## 2023-07-14 LAB — PREPARE RBC (CROSSMATCH)

## 2023-07-14 LAB — TROPONIN I (HIGH SENSITIVITY): Troponin I (High Sensitivity): 24 ng/L — ABNORMAL HIGH (ref <18)

## 2023-07-14 MED ORDER — INSULIN ASPART 100 UNIT/ML IJ SOLN: 0.0000 [IU] | Freq: Every day | INTRAMUSCULAR | Status: DC

## 2023-07-14 MED ORDER — ONDANSETRON HCL 4 MG PO TABS: 4.0000 mg | ORAL_TABLET | Freq: Four times a day (QID) | ORAL | Status: DC | PRN

## 2023-07-14 MED ORDER — IRBESARTAN 150 MG PO TABS
300.0000 mg | ORAL_TABLET | Freq: Every day | ORAL | Status: DC
  Administered 2023-07-15 – 2023-07-16 (×2): 300 mg via ORAL
  Filled 2023-07-14 (×2): qty 2

## 2023-07-14 MED ORDER — SODIUM CHLORIDE 0.9% IV SOLUTION: Freq: Once | INTRAVENOUS | Status: DC

## 2023-07-14 MED ORDER — LEVOTHYROXINE SODIUM 50 MCG PO TABS
50.0000 ug | ORAL_TABLET | Freq: Every day | ORAL | Status: DC
  Administered 2023-07-15 – 2023-07-16 (×2): 50 ug via ORAL
  Filled 2023-07-14 (×2): qty 1

## 2023-07-14 MED ORDER — PANTOPRAZOLE SODIUM 40 MG IV SOLR
40.0000 mg | INTRAVENOUS | Status: AC
  Administered 2023-07-14: 40 mg via INTRAVENOUS
  Filled 2023-07-14: qty 10

## 2023-07-14 MED ORDER — PANTOPRAZOLE SODIUM 40 MG IV SOLR
40.0000 mg | Freq: Two times a day (BID) | INTRAVENOUS | Status: DC
  Administered 2023-07-15 – 2023-07-16 (×4): 40 mg via INTRAVENOUS
  Filled 2023-07-14 (×4): qty 10

## 2023-07-14 MED ORDER — DILTIAZEM HCL ER COATED BEADS 120 MG PO CP24
360.0000 mg | ORAL_CAPSULE | Freq: Every day | ORAL | Status: DC
  Administered 2023-07-15 (×2): 360 mg via ORAL
  Filled 2023-07-14 (×2): qty 3

## 2023-07-14 MED ORDER — ONDANSETRON HCL 4 MG/2ML IJ SOLN: 4.0000 mg | Freq: Four times a day (QID) | INTRAMUSCULAR | Status: DC | PRN

## 2023-07-14 MED ORDER — INSULIN ASPART 100 UNIT/ML IJ SOLN: 0.0000 [IU] | Freq: Three times a day (TID) | INTRAMUSCULAR | Status: DC

## 2023-07-14 MED ORDER — DILTIAZEM HCL ER 360 MG PO TB24
360.0000 mg | ORAL_TABLET | Freq: Every day | ORAL | Status: DC
  Filled 2023-07-14: qty 1

## 2023-07-14 NOTE — ED Provider Notes (Signed)
 [place] EMERGENCY DEPARTMENT AT [facility] Provider Note   CSN: [id number] Arrival date & time: 07/14/23  1312     History  Chief Complaint  Patient presents with   abnormal labs    Calvin Green is a 67 y.o. male.  HPI   This patient is a 68 year old male history of gout, hypertension, hypothyroidism, diabetes, acid reflux and a history of gastrointestinal bleeding in the past, he reports that he has been diagnosed with AVMs and is followed by Dr. Kimble Pennant with gastroenterology in [place].  He has had some black stools over the last couple of weeks and has been feeling generally weak and progressively dyspneic especially on exertion, he was seen by his family doctor and told that he was anemic, he went back for lab work a couple of days ago and was called and told that his hemoglobin was 5.8 and he needed to come to the hospital for likely transfusion.  He does report a history of several visits to the hospital in the past where he required transfusions, he has been intermittently on iron  products because of chronic anemia.  The patient has a history of normal kidney function and blood counts in 2023 when he last had lab work done 2 years ago.  Home Medications Prior to Admission medications   Medication Sig Start Date End Date Taking? Authorizing Provider  acetaminophen  (TYLENOL ) 650 MG CR tablet Take 650 mg by mouth every 8 (eight) hours as needed (headache).   Yes [provider]  colchicine 0.6 MG tablet Take 2 tablets by mouth daily as needed (gout). 02/12/23  Yes [provider]  diltiazem  (CARDIZEM  LA) 360 MG 24 hr tablet Take 360 mg by mouth at bedtime. 05/27/23  Yes [provider]  Esomeprazole Magnesium (NEXIUM 24HR PO) Take 1 capsule by mouth at bedtime.   Yes [provider]  FEROSUL 325 (65 Fe) MG tablet Take 325 mg by mouth 3 (three) times daily. 07/10/23  Yes [provider]  IRON  PO Take 2 tablets by mouth in the  morning and at bedtime. [employer] Blood Builder   Yes [provider]  JANUMET 50-1000 MG tablet Take 1 tablet by mouth 2 (two) times daily. 05/20/23  Yes [provider]  levothyroxine  (SYNTHROID ) 50 MCG tablet Take 50 mcg by mouth daily. 04/27/23  Yes [provider]  olmesartan (BENICAR) 40 MG tablet Take 40 mg by mouth daily. 03/31/21  Yes [provider]  Probiotic Product (ALIGN PO) Take 1 capsule by mouth at bedtime.   Yes [provider]      Allergies    Patient has no known allergies.    Review of Systems   Review of Systems  All other systems reviewed and are negative.   Physical Exam Updated Vital Signs BP (!) 156/94   Pulse 79   Temp 98.1 F (36.7 C) (Oral)   Resp (!) 21   Ht 1.829 m (6')   Wt 94.3 kg   SpO2 96%   BMI 28.21 kg/m  Physical Exam Vitals and nursing note reviewed.  Constitutional:      General: He is not in acute distress.    Appearance: He is well-developed.  HENT:     Head: Normocephalic and atraumatic.     Mouth/Throat:     Pharynx: No oropharyngeal exudate.  Eyes:     General: No scleral icterus.       Right eye: No discharge.  Left eye: No discharge.     Conjunctiva/sclera: Conjunctivae normal.     Pupils: Pupils are equal, round, and reactive to light.  Neck:     Thyroid: No thyromegaly.     Vascular: No JVD.  Cardiovascular:     Rate and Rhythm: Normal rate and regular rhythm.     Heart sounds: Normal heart sounds. No murmur heard.    No friction rub. No gallop.  Pulmonary:     Effort: Pulmonary effort is normal. No respiratory distress.     Breath sounds: Normal breath sounds. No wheezing or rales.     Comments: The patient is visibly short of breath but has clear lungs, with talking he becomes dyspneic after a minute or 2 and has to take a break to breathe Abdominal:     General: Bowel sounds are normal. There is no distension.     Palpations: Abdomen is soft. There is no mass.      Tenderness: There is no abdominal tenderness.  Musculoskeletal:        General: No tenderness. Normal range of motion.     Cervical back: Normal range of motion and neck supple.     Right lower leg: Edema present.     Left lower leg: Edema present.     Comments: 1+ pretibial symmetrical lower extremity edema  Lymphadenopathy:     Cervical: No cervical adenopathy.  Skin:    General: Skin is warm and dry.     Findings: No erythema or rash.  Neurological:     General: No focal deficit present.     Mental Status: He is alert.     Coordination: Coordination normal.  Psychiatric:        Behavior: Behavior normal.     ED Results / Procedures / Treatments   Labs (all labs ordered are listed, but only abnormal results are displayed) Labs Reviewed  CBC WITH DIFFERENTIAL/PLATELET - Abnormal; Notable for the following components:      Result Value   RBC 3.50 (*)    Hemoglobin 5.7 (*)    HCT 24.2 (*)    MCV 69.1 (*)    MCH 16.3 (*)    MCHC 23.6 (*)    RDW 24.3 (*)    Platelets 453 (*)    All other components within normal limits  COMPREHENSIVE METABOLIC PANEL WITH GFR - Abnormal; Notable for the following components:   Glucose, Bld 102 (*)    Calcium 8.6 (*)    Albumin 3.2 (*)    All other components within normal limits  TROPONIN I (HIGH SENSITIVITY) - Abnormal; Notable for the following components:   Troponin I (High Sensitivity) 24 (*)    All other components within normal limits  PROTIME-INR  BRAIN NATRIURETIC PEPTIDE  POC OCCULT BLOOD, ED  TYPE AND SCREEN  PREPARE RBC (CROSSMATCH)    EKG EKG Interpretation Date/Time:  Sunday July 14 2023 15:27:44 EDT Ventricular Rate:  81 PR Interval:  115 QRS Duration:  115 QT Interval:  419 QTC Calculation: 487 R Axis:   43  Text Interpretation: Sinus rhythm Borderline short PR interval Nonspecific intraventricular conduction delay Nonspecific repol abnormality, diffuse leads since last tracing no significant change Confirmed  by Early Glisson ([id number]) on 07/14/2023 5:33:08 PM  Radiology DG Chest Port 1 View Result Date: 07/14/2023 CLINICAL DATA:  Shortness of breath EXAM: PORTABLE CHEST 1 VIEW COMPARISON:  07/08/2023 FINDINGS: Possible trace pleural effusions. No consolidation. Retrocardiac opacity compatible with hiatal hernia. Normal cardiac size. No  pneumothorax IMPRESSION: Possible trace pleural effusions. Hiatal hernia. Electronically Signed   By: Esmeralda Hedge M.D.   On: 07/14/2023 16:40    Procedures .Critical Care  Performed by: Early Glisson, MD Authorized by: Early Glisson, MD   Critical care provider statement:    Critical care time (minutes):  45   Critical care time was exclusive of:  Separately billable procedures and treating other patients and teaching time   Critical care was necessary to treat or prevent imminent or life-threatening deterioration of the following conditions: Severe anemia, GI bleed.   Critical care was time spent personally by me on the following activities:  Development of treatment plan with patient or surrogate, discussions with consultants, evaluation of patient's response to treatment, examination of patient, obtaining history from patient or surrogate, review of old charts, re-evaluation of patient's condition, pulse oximetry, ordering and review of radiographic studies, ordering and review of laboratory studies and ordering and performing treatments and interventions   I assumed direction of critical care for this patient from another provider in my specialty: no     Care discussed with: admitting provider   Comments:           Medications Ordered in ED Medications  0.9 %  sodium chloride  infusion (Manually program via Guardrails IV Fluids) (has no administration in time range)  pantoprazole  (PROTONIX ) injection 40 mg (40 mg Intravenous Given 07/14/23 1550)    ED Course/ Medical Decision Making/ A&P                                 Medical Decision Making Amount  and/or Complexity of Data Reviewed Labs: ordered. Radiology: ordered. ECG/medicine tests: ordered.  Risk Prescription drug management. Decision regarding hospitalization.    This patient presents to the ED for concern of anemia shortness of breath and weakness, this involves an extensive number of treatment options, and is a complaint that carries with it a high risk of complications and morbidity.  The differential diagnosis includes gastrointestinal bleeding, congestive heart failure, cardiac disease, renal dysfunction, liver dysfunction   Co morbidities that complicate the patient evaluation  Known history of recurrent anemia with gastrointestinal bleeding, he is not anticoagulated   Additional history obtained:  Additional history obtained from medical record External records from outside source obtained and reviewed including all his visits from his primary care doctor and prior lab visits from hospitalizations from 2 years ago   Lab Tests:  I Ordered, and personally interpreted labs.  The pertinent results include: Severe anemia, hemoglobin of 5.7, last measured at about 14 in 2023, platelets slightly high, white blood cells normal, metabolic panel normal, INR normal, troponin 24, similar to multiple prior values   Imaging Studies ordered:  I ordered imaging studies including chest x-ray I independently visualized and interpreted imaging which showed pleural effusions I agree with the radiologist interpretation   Cardiac Monitoring: / EKG:  The patient was maintained on a cardiac monitor.  I personally viewed and interpreted the cardiac monitored which showed an underlying rhythm of: Normal sinus rhythm   Problem List / ED Course / Critical interventions / Medication management  The patient has a severely low hemoglobin, measured at 5.7, this appears to be consistent with a upper GI bleed, unfortunately he has had severe anemia in the past and required blood  transfusions on multiple different occasions and will need an acute blood transfusion while in the emergency department today due  to the severe anemia.  He does appear dyspneic when he exercises or even walks across the room, he does not appear to have significant pulmonary edema but does have some bilateral pleural effusions.  BNP is pending I ordered medication including blood transfusion for severe anemia, to be performed in the emergency department Reevaluation of the patient after these medicines showed that the patient slightly improved I have reviewed the patients home medicines and have made adjustments as needed   Consultations Obtained:  I requested consultation with the gastroenterologist Dr. Mordechai April and the hospitalist Dr. Cathyann Cobia,  and discussed lab and imaging findings as well as pertinent plan - they recommend: Admission, will likely need upper GI endoscopy in the morning   Social Determinants of Health:  Tobacco use, chewing tobacco   Test / Admission - Considered:  To hospital to higher level of care         Final Clinical Impression(s) / ED Diagnoses Final diagnoses:  Severe anemia  Upper GI bleed    Rx / DC Orders ED Discharge Orders     None         Early Glisson, MD 07/14/23 1739

## 2023-07-14 NOTE — H&P (Signed)
 History and Physical    Patient: Calvin Green:[id number] DOB: [birth year] DOA: 07/14/2023 DOS: the patient was seen and examined on 07/14/2023 PCP: Minus Amel, MD  Patient coming from: Home  Chief Complaint:  Chief Complaint  Patient presents with   abnormal labs   HPI: Calvin Green is a 68 y.o. male with medical history significant of hypertension, diabetes, obesity, fatty liver, fatty liver, gastritis with a history of GI bleeding in the past.  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  He is followed by Dr. Kimble Pennant with GI in [place].  Over the last week, he has had black stools which coincided with the time that he started his iron .  Overall, he has been feeling weak and fatigued and progressively dyspneic.  Patient went to his primary care physician who ordered some labs.  His labs returned Friday showing a hemoglobin of 5.9.  He came into the hospital due to his worsening symptoms and anemia.  No fevers, chills, nausea, vomiting.  His abdomen is tender throughout.  Review of Systems: As mentioned in the history of present illness. All other systems reviewed and are negative. Past Medical History:  Diagnosis Date   Anemia    Diabetes mellitus without complication (HCC)    Fatty liver    Gout    Hypertension    Obesity    History reviewed. No pertinent surgical history. Social History:  reports that he has never smoked. His smokeless tobacco use includes chew. He reports current alcohol use. He reports that he does not use drugs.  No Known Allergies  Family History  Problem Relation Age of Onset   Hypertension Other    Diabetes Other    Gout Other     Prior to Admission medications   Medication Sig Start Date End Date Taking? Authorizing Provider  acetaminophen  (TYLENOL ) 650 MG CR tablet Take 650 mg by mouth every 8 (eight) hours as needed (headache).   Yes [provider]  colchicine 0.6 MG tablet Take 2 tablets by mouth daily as  needed (gout). 02/12/23  Yes [provider]  diltiazem  (CARDIZEM  LA) 360 MG 24 hr tablet Take 360 mg by mouth at bedtime. 05/27/23  Yes [provider]  Esomeprazole Magnesium (NEXIUM 24HR PO) Take 1 capsule by mouth at bedtime.   Yes [provider]  FEROSUL 325 (65 Fe) MG tablet Take 325 mg by mouth 3 (three) times daily. 07/10/23  Yes [provider]  IRON  PO Take 2 tablets by mouth in the morning and at bedtime. [employer] Blood Builder   Yes [provider]  JANUMET 50-1000 MG tablet Take 1 tablet by mouth 2 (two) times daily. 05/20/23  Yes [provider]  levothyroxine  (SYNTHROID ) 50 MCG tablet Take 50 mcg by mouth daily. 04/27/23  Yes [provider]  olmesartan (BENICAR) 40 MG tablet Take 40 mg by mouth daily. 03/31/21  Yes [provider]  Probiotic Product (ALIGN PO) Take 1 capsule by mouth at bedtime.   Yes [provider]    Physical Exam: Vitals:   07/14/23 1810 07/14/23 1825 07/14/23 1830 07/14/23 1845  BP: (!) 162/90 (!) 165/91 128/61 (!) 180/93  Pulse: 78 80 81 87  Resp: 17 18 16  (!) 21  Temp: 98.7 F (37.1 C) 97.9 F (36.6 C)    TempSrc: Oral Oral    SpO2:   99% 97%  Weight:      Height:  General: Elderly male. Awake and alert and oriented x3. No acute cardiopulmonary distress.  HEENT: Normocephalic atraumatic.  Right and left ears normal in appearance.  Pupils equal, round, reactive to light. Extraocular muscles are intact. Sclerae anicteric and noninjected.  Moist mucosal membranes. No mucosal lesions.  Neck: Neck supple without lymphadenopathy. No carotid bruits. No masses palpated.  Cardiovascular: Regular rate with normal S1-S2 sounds. No murmurs, rubs, gallops auscultated. No JVD.  Respiratory: Good respiratory effort with no wheezes, rales, rhonchi. Lungs clear to auscultation bilaterally.  No accessory muscle use. Abdomen: Soft, mild tenderness throughout, nondistended. Active bowel  sounds. No masses or hepatosplenomegaly  Skin: No rashes, lesions, or ulcerations.  Dry, warm to touch. 2+ dorsalis pedis and radial pulses. Musculoskeletal: No calf or leg pain. All major joints not erythematous nontender.  No upper or lower joint deformation.  Good ROM.  No contractures  Psychiatric: Intact judgment and insight. Pleasant and cooperative. Neurologic: No focal neurological deficits. Strength is 5/5 and symmetric in upper and lower extremities.  Cranial nerves II through XII are grossly intact.  Data Reviewed: Results for orders placed or performed during the hospital encounter of 07/14/23 (from the past 24 hours)  Type and screen     Status: None (Preliminary result)   Collection Time: 07/14/23  3:35 PM  Result Value Ref Range   ABO/RH(D) AB POS    Antibody Screen NEG    Sample Expiration 07/17/2023,2359    Unit Number [id number]    Blood Component Type RED CELLS,LR    Unit division 00    Status of Unit ALLOCATED    Transfusion Status OK TO TRANSFUSE    Crossmatch Result Compatible    Unit Number [id number]    Blood Component Type RED CELLS,LR    Unit division 00    Status of Unit ISSUED    Transfusion Status OK TO TRANSFUSE    Crossmatch Result      Compatible Performed at [facility], [address], [place], [place] [address]   CBC with Differential     Status: Abnormal   Collection Time: 07/14/23  3:35 PM  Result Value Ref Range   WBC 7.9 4.0 - 10.5 K/uL   RBC 3.50 (L) 4.22 - 5.81 MIL/uL   Hemoglobin 5.7 (LL) 13.0 - 17.0 g/dL   HCT 78.4 (L) 69.6 - 29.5 %   MCV 69.1 (L) 80.0 - 100.0 fL   MCH 16.3 (L) 26.0 - 34.0 pg   MCHC 23.6 (L) 30.0 - 36.0 g/dL   RDW 28.4 (H) 13.2 - 44.0 %   Platelets 453 (H) 150 - 400 K/uL   nRBC 0.0 0.0 - 0.2 %   Neutrophils Relative % 74 %   Neutro Abs 5.9 1.7 - 7.7 K/uL   Lymphocytes Relative 14 %   Lymphs Abs 1.1 0.7 - 4.0 K/uL   Monocytes Relative 9 %   Monocytes Absolute 0.7 0.1 - 1.0 K/uL   Eosinophils Relative 1 %    Eosinophils Absolute 0.1 0.0 - 0.5 K/uL   Basophils Relative 1 %   Basophils Absolute 0.1 0.0 - 0.1 K/uL   WBC Morphology MORPHOLOGY UNREMARKABLE    RBC Morphology See Note    Smear Review MORPHOLOGY UNREMARKABLE    Immature Granulocytes 1 %   Abs Immature Granulocytes 0.04 0.00 - 0.07 K/uL   Polychromasia PRESENT    Ovalocytes PRESENT   Comprehensive metabolic panel     Status: Abnormal   Collection Time: 07/14/23  3:35 PM  Result  Value Ref Range   Sodium 137 135 - 145 mmol/L   Potassium 3.9 3.5 - 5.1 mmol/L   Chloride 105 98 - 111 mmol/L   CO2 22 22 - 32 mmol/L   Glucose, Bld 102 (H) 70 - 99 mg/dL   BUN 12 8 - 23 mg/dL   Creatinine, Ser 1.61 0.61 - 1.24 mg/dL   Calcium 8.6 (L) 8.9 - 10.3 mg/dL   Total Protein 6.6 6.5 - 8.1 g/dL   Albumin 3.2 (L) 3.5 - 5.0 g/dL   AST 18 15 - 41 U/L   ALT 18 0 - 44 U/L   Alkaline Phosphatase 84 38 - 126 U/L   Total Bilirubin 0.5 0.0 - 1.2 mg/dL   GFR, Estimated >09 >60 mL/min   Anion gap 10 5 - 15  Brain natriuretic peptide     Status: Abnormal   Collection Time: 07/14/23  3:35 PM  Result Value Ref Range   B Natriuretic Peptide 332.0 (H) 0.0 - 100.0 pg/mL  Troponin I (High Sensitivity)     Status: Abnormal   Collection Time: 07/14/23  3:35 PM  Result Value Ref Range   Troponin I (High Sensitivity) 24 (H) <18 ng/L  Protime-INR     Status: None   Collection Time: 07/14/23  3:35 PM  Result Value Ref Range   Prothrombin Time 14.3 11.4 - 15.2 seconds   INR 1.1 0.8 - 1.2  Prepare RBC (crossmatch)     Status: None   Collection Time: 07/14/23  5:32 PM  Result Value Ref Range   Order Confirmation      ORDER PROCESSED BY BLOOD BANK Performed at [facility], [address], [place], [place] [address]     DG Chest Port 1 View Result Date: 07/14/2023 CLINICAL DATA:  Shortness of breath EXAM: PORTABLE CHEST 1 VIEW COMPARISON:  07/08/2023 FINDINGS: Possible trace pleural effusions. No consolidation. Retrocardiac opacity compatible with hiatal  hernia. Normal cardiac size. No pneumothorax IMPRESSION: Possible trace pleural effusions. Hiatal hernia. Electronically Signed   By: Esmeralda Hedge M.D.   On: 07/14/2023 16:40     Assessment and Plan: No notes have been filed under this hospital service. Service: Hospitalist  Principal Problem:   Symptomatic anemia Active Problems:   Gastritis   Diabetes mellitus without complication (HCC)   Hypertension   Gout   Upper GI bleed   Elevated brain natriuretic peptide (BNP) level  Symptomatic anemia Infuse 2 units Repeat CBC in the morning Gastritis Protonix  twice daily GI consult N.p.o. after midnight Upper GI bleed Protonix  Diabetes Hold Janumet CBGs AC and nightly Hypertension Continue blood pressure control Elevated BNP Probably secondary to high-output failure Check echocardiogram   Advance Care Planning:   Code Status: Full Code  Consults: GI  Family Communication: wife present during interview and exam.  Severity of Illness: The appropriate patient status for this patient is INPATIENT. Inpatient status is judged to be reasonable and necessary in order to provide the required intensity of service to ensure the patient's safety. The patient's presenting symptoms, physical exam findings, and initial radiographic and laboratory data in the context of their chronic comorbidities is felt to place them at high risk for further clinical deterioration. Furthermore, it is not anticipated that the patient will be medically stable for discharge from the hospital within 2 midnights of admission.   * I certify that at the point of admission it is my clinical judgment that the patient will require inpatient hospital care spanning beyond 2 midnights from  the point of admission due to high intensity of service, high risk for further deterioration and high frequency of surveillance required.*  Author: Kern Gingras J Shayden Gingrich, DO 07/14/2023 7:11 PM  For on call review [email or web address].

## 2023-07-14 NOTE — ED Triage Notes (Signed)
 Pt to ER states he had blood work on Monday showed critical low Hgb, was sent for recheck on Friday.  Has not heard from PCP office, but received results from Mychart Hgb 5.8.

## 2023-07-15 ENCOUNTER — Inpatient Hospital Stay (HOSPITAL_BASED_OUTPATIENT_CLINIC_OR_DEPARTMENT_OTHER)

## 2023-07-15 DIAGNOSIS — D649 Anemia, unspecified: Secondary | ICD-10-CM

## 2023-07-15 DIAGNOSIS — I509 Heart failure, unspecified: Secondary | ICD-10-CM

## 2023-07-15 DIAGNOSIS — R600 Localized edema: Secondary | ICD-10-CM | POA: Diagnosis not present

## 2023-07-15 DIAGNOSIS — Z8719 Personal history of other diseases of the digestive system: Secondary | ICD-10-CM

## 2023-07-15 DIAGNOSIS — D509 Iron deficiency anemia, unspecified: Secondary | ICD-10-CM | POA: Diagnosis not present

## 2023-07-15 DIAGNOSIS — K449 Diaphragmatic hernia without obstruction or gangrene: Secondary | ICD-10-CM | POA: Diagnosis not present

## 2023-07-15 LAB — TYPE AND SCREEN
ABO/RH(D): AB POS
Antibody Screen: NEGATIVE
Unit division: 0
Unit division: 0

## 2023-07-15 LAB — GLUCOSE, CAPILLARY
Glucose-Capillary: 119 mg/dL — ABNORMAL HIGH (ref 70–99)
Glucose-Capillary: 106 mg/dL — ABNORMAL HIGH (ref 70–99)
Glucose-Capillary: 112 mg/dL — ABNORMAL HIGH (ref 70–99)
Glucose-Capillary: 113 mg/dL — ABNORMAL HIGH (ref 70–99)

## 2023-07-15 LAB — BASIC METABOLIC PANEL WITH GFR
Anion gap: 9 (ref 5–15)
BUN: 14 mg/dL (ref 8–23)
CO2: 22 mmol/L (ref 22–32)
Calcium: 8.4 mg/dL — ABNORMAL LOW (ref 8.9–10.3)
Chloride: 107 mmol/L (ref 98–111)
Creatinine, Ser: 0.99 mg/dL (ref 0.61–1.24)
GFR, Estimated: >60 mL/min (ref >60)
Glucose, Bld: 120 mg/dL — ABNORMAL HIGH (ref 70–99)
Potassium: 4 mmol/L (ref 3.5–5.1)
Sodium: 138 mmol/L (ref 135–145)

## 2023-07-15 LAB — ECHOCARDIOGRAM COMPLETE
AR max vel: 2.14 cm2
AV Area VTI: 1.91 cm2
AV Area mean vel: 2.29 cm2
AV Mean grad: 4 mmHg
AV Peak grad: 6.7 mmHg
Ao pk vel: 1.29 m/s
Area-P 1/2: 5.84 cm2
Height: 72 in
MV VTI: 1.88 cm2
S' Lateral: 5.2 cm
Weight: 3328 [oz_av]

## 2023-07-15 LAB — BPAM RBC
Blood Product Expiration Date: 202504272359
Blood Product Expiration Date: 202505172359
ISSUE DATE / TIME: 202504201804
ISSUE DATE / TIME: 202504202150
Unit Type and Rh: 6200
Unit Type and Rh: 6200

## 2023-07-15 LAB — CBC
HCT: 28.6 % — ABNORMAL LOW (ref 39.0–52.0)
Hemoglobin: 7.7 g/dL — ABNORMAL LOW (ref 13.0–17.0)
MCH: 19.3 pg — ABNORMAL LOW (ref 26.0–34.0)
MCHC: 26.9 g/dL — ABNORMAL LOW (ref 30.0–36.0)
MCV: 71.5 fL — ABNORMAL LOW (ref 80.0–100.0)
Platelets: 393 10*3/uL (ref 150–400)
RBC: 4 MIL/uL — ABNORMAL LOW (ref 4.22–5.81)
RDW: 24.9 % — ABNORMAL HIGH (ref 11.5–15.5)
WBC: 9 10*3/uL (ref 4.0–10.5)
nRBC: 0.2 % (ref 0.0–0.2)

## 2023-07-15 LAB — HIV ANTIBODY (ROUTINE TESTING W REFLEX): HIV Screen 4th Generation wRfx: NONREACTIVE

## 2023-07-15 LAB — HEMOGLOBIN A1C
Hgb A1c MFr Bld: 5.4 % (ref 4.8–5.6)
Mean Plasma Glucose: 108.28 mg/dL

## 2023-07-15 MED ORDER — SODIUM CHLORIDE 0.9 % IV SOLN: INTRAVENOUS | Status: AC

## 2023-07-15 MED ORDER — PEG 3350-KCL-NA BICARB-NACL 420 G PO SOLR
4000.0000 mL | Freq: Once | ORAL | Status: AC
  Administered 2023-07-15: 4000 mL via ORAL

## 2023-07-15 MED ORDER — SODIUM CHLORIDE 0.9 % IV SOLN: INTRAVENOUS | Status: DC

## 2023-07-15 MED ORDER — FUROSEMIDE 10 MG/ML IJ SOLN
20.0000 mg | Freq: Once | INTRAMUSCULAR | Status: AC
  Administered 2023-07-15: 20 mg via INTRAVENOUS
  Filled 2023-07-15: qty 2

## 2023-07-15 NOTE — Consult Note (Addendum)
 @LOGO @   Referring Provider: Hospitalist Primary Care Physician:  Minus Amel, MD Primary Gastroenterologist:  Dr. Kimble Pennant  Date of Admission: 07/14/23 Date of Consultation: 07/15/23  Reason for Consultation:  Acute anemia with black stool  HPI:  Calvin Green is a 68 y.o. year old male with history of gout, HTN, hypothyroidism, diabetes, GERD, H. pylori, fatty liver, who presented to the ER due to outpatient labs with PCP showing anemia.    ED course: Vital signs within normal limits aside from mild hypertension.   Labs remarkable for hemoglobin 5.7 with microcytic indices, down from 14.1, 2 years ago.  BNP elevated at 332.  Troponin 24.  Chest x-ray with possible trace pleural effusions.  Hiatal hernia.   He was started on IV pantoprazole  40 mg twice daily and 2 units PRBCs were ordered. ECHO was ordered.    Today: Hemoglobin improved to 7.7.  Remains hemodynamically stable.  O2 saturation, 93% on room air.   Consult:  Reports history of AVMs in his small bowel.   Black stools intermittently for the last 2 weeks. Also started FeroSul about 2 weeks ago.  States he has never tolerated oral iron  by mouth.  It causes GI upset.  States every time he would take this, he would end up having a loose, black bowel movement very soon thereafter.  On a chronic basis, he takes "blood builders" every day which Dr. Kimble Pennant had approved.  He last took [facility] Friday and his last black bowel movement was Friday or Saturday.  Denies any other significant GI symptoms such as abdominal pain, nausea, vomiting.  History of reflux is well-controlled on over-the-counter Nexium.  No dysphagia.   He has also noticed increasing shortness of breath over the last week.  Reports O2 saturation would drop down into the 80s when walking across the room.  Last night, O2 was dropping into the 80s while sleeping and he was placed on supplemental oxygen in the ER, but is not requiring oxygen at this time.   Continues to feel short of breath at times, primarily if getting up.   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    NSAIDs: No ETOH: No Tobacco: Chews tobacco.    Reports EGD and colonoscopy about 5 years ago with Dr. Kimble Pennant.  Last capsule study was several years.  Unable to tell me when, but states he was found to have AVMs in his small bowel.     Prior GI procedures on file:  EGD and colonoscopy 09/12/2002: Moderate size hiatal hernia with somewhat prominent folds.  2 areas of centrally eroded volcano like erosions in the antrum of uncertain significance.  Colonoscopy findings include left-sided diverticula, 5 mm polyp.  EGD and colonoscopy 03/24/2008: Left-sided diverticula, polyp in the mid descending colon s/p resection, noncritical Schatzki's ring not manipulated, moderate size hiatal hernia, nodular antral folds with overlying superficial erosion of uncertain significance s/p biopsy.  Some subtle geographic changes in D2, D3 mucosa s/p biopsy.  Pathology showed colonic tubular adenoma, benign duodenal mucosa, chronic active gastritis with H. pylori.   Givens capsule 05/20/2008: Normal small bowel.  Past Medical History:  Diagnosis Date   Anemia    Diabetes mellitus without complication (HCC)    Fatty liver    Gout    Hypertension    Obesity     History reviewed. No pertinent surgical history.  Prior to Admission medications   Medication Sig Start Date End Date Taking? Authorizing Provider  acetaminophen  (TYLENOL ) 650 MG CR tablet  Take 650 mg by mouth every 8 (eight) hours as needed (headache).   Yes [provider]  colchicine 0.6 MG tablet Take 2 tablets by mouth daily as needed (gout). 02/12/23  Yes [provider]  diltiazem  (CARDIZEM  LA) 360 MG 24 hr tablet Take 360 mg by mouth at bedtime. 05/27/23  Yes [provider]  Esomeprazole Magnesium (NEXIUM 24HR PO) Take 1 capsule by mouth at bedtime.   Yes [provider]   FEROSUL 325 (65 Fe) MG tablet Take 325 mg by mouth 3 (three) times daily. 07/10/23  Yes [provider]  IRON  PO Take 2 tablets by mouth in the morning and at bedtime. [employer] Blood Builder   Yes [provider]  JANUMET 50-1000 MG tablet Take 1 tablet by mouth 2 (two) times daily. 05/20/23  Yes [provider]  levothyroxine  (SYNTHROID ) 50 MCG tablet Take 50 mcg by mouth daily. 04/27/23  Yes [provider]  olmesartan (BENICAR) 40 MG tablet Take 40 mg by mouth daily. 03/31/21  Yes [provider]  Probiotic Product (ALIGN PO) Take 1 capsule by mouth at bedtime.   Yes [provider]    Current Facility-Administered Medications  Medication Dose Route Frequency Provider Last Rate Last Admin   0.9 %  sodium chloride  infusion (Manually program via Guardrails IV Fluids)   Intravenous Once Stinson, Jacob J, DO       diltiazem  (CARDIZEM  CD) 24 hr capsule 360 mg  360 mg Oral QHS Stinson, Jacob J, DO   360 mg at 07/15/23 0000   insulin  aspart (novoLOG ) injection 0-15 Units  0-15 Units Subcutaneous TID WC Stinson, Jacob J, DO       insulin  aspart (novoLOG ) injection 0-5 Units  0-5 Units Subcutaneous QHS Stinson, Jacob J, DO       irbesartan  (AVAPRO ) tablet 300 mg  300 mg Oral Daily Stinson, Jacob J, DO   300 mg at 07/15/23 4540   levothyroxine  (SYNTHROID ) tablet 50 mcg  50 mcg Oral Daily Stinson, Jacob J, DO   50 mcg at 07/15/23 9811   ondansetron  (ZOFRAN ) tablet 4 mg  4 mg Oral Q6H PRN Stinson, Jacob J, DO       Or   ondansetron  (ZOFRAN ) injection 4 mg  4 mg Intravenous Q6H PRN Stinson, Jacob J, DO       pantoprazole  (PROTONIX ) injection 40 mg  40 mg Intravenous Q12H Stinson, Jacob J, DO   40 mg at 07/15/23 9147    Allergies as of 07/14/2023   (No Known Allergies)    Family History  Problem Relation Age of Onset   Hypertension Other    Diabetes Other    Gout Other     Social History   Socioeconomic History   Marital status: Married     Spouse name: Not on file   Number of children: Not on file   Years of education: Not on file   Highest education level: Not on file  Occupational History   Not on file  Tobacco Use   Smoking status: Never   Smokeless tobacco: Current    Types: Chew  Substance and Sexual Activity   Alcohol use: Yes    Comment: occas   Drug use: No   Sexual activity: Not on file  Other Topics Concern   Not on file  Social History Narrative   Not on file   Social Drivers of Health   Financial Resource Strain: Not on file  Food Insecurity: No Food  Insecurity (07/14/2023)   Hunger Vital Sign    Worried About Running Out of Food in the Last Year: Never true    Ran Out of Food in the Last Year: Never true  Transportation Needs: No Transportation Needs (07/14/2023)   PRAPARE - Administrator, Civil Service (Medical): No    Lack of Transportation (Non-Medical): No  Physical Activity: Not on file  Stress: Not on file  Social Connections: Unknown (07/14/2023)   Social Connection and Isolation Panel [NHANES]    Frequency of Communication with Friends and Family: More than three times a week    Frequency of Social Gatherings with Friends and Family: More than three times a week    Attends Religious Services: Patient unable to answer    Active Member of Clubs or Organizations: No    Attends Banker Meetings: 1 to 4 times per year    Marital Status: Married  Catering manager Violence: Not At Risk (07/14/2023)   Humiliation, Afraid, Rape, and Kick questionnaire    Fear of Current or Ex-Partner: No    Emotionally Abused: No    Physically Abused: No    Sexually Abused: No    Review of Systems: Gen: Denies fever, chills, cold or flulike symptoms, presyncope, syncope. CV: Denies chest pain, heart palpitations. Resp: Admits to shortness of breath. GI: See HPI GU : Denies urinary burning, urinary frequency, urinary incontinence.  MS: Denies joint pain. Derm: Denies rash. Psych:  Denies depression, anxiety. Heme: See HPI  Physical Exam: Vital signs in last 24 hours: Temp:  [97.6 F (36.4 C)-98.7 F (37.1 C)] 97.8 F (36.6 C) (04/21 0753) Pulse Rate:  [71-91] 76 (04/21 0753) Resp:  [15-33] 17 (04/21 0753) BP: (128-181)/(61-113) 160/97 (04/21 0753) SpO2:  [91 %-100 %] 93 % (04/21 0753) FiO2 (%):  [21 %] 21 % (04/20 2040) Weight:  [94.3 kg] 94.3 kg (04/20 1341)   General:   Alert,  Well-developed, well-nourished, pleasant and cooperative. Appears short of breath while talking. Speaks in short sentences.  Head:  Normocephalic and atraumatic. Eyes:  Sclera clear, no icterus.   Conjunctiva pink. Ears:  Normal auditory acuity. Nose:  No deformity, discharge,  or lesions. Lungs:  Clear throughout to auscultation.   No wheezes, crackles, or rhonchi. No acute distress. Heart:  Regular rate and rhythm; no murmurs, clicks, rubs,  or gallops. Abdomen:  Soft, nontender and nondistended. No masses, hepatosplenomegaly or hernias noted. Normal bowel sounds, without guarding, and without rebound.   Rectal:  Deferred  Msk:  Symmetrical without gross deformities. Normal posture. Extremities:  With 1+ bilateral LE edema. Neurologic:  Alert and  oriented x4;  grossly normal neurologically. Skin:  Intact without significant lesions or rashes. Cervical Nodes:  No significant cervical adenopathy. Psych:  Normal mood and affect.  Intake/Output from previous day: 04/20 0701 - 04/21 0700 In: 730 [I.V.:100; Blood:630] Out: 300 [Urine:300] Intake/Output this shift: No intake/output data recorded.  Lab Results: Recent Labs    07/14/23 1535 07/15/23 0228  WBC 7.9 9.0  HGB 5.7* 7.7*  HCT 24.2* 28.6*  PLT 453* 393   BMET Recent Labs    07/14/23 1535 07/15/23 0228  NA 137 138  K 3.9 4.0  CL 105 107  CO2 22 22  GLUCOSE 102* 120*  BUN 12 14  CREATININE 1.05 0.99  CALCIUM 8.6* 8.4*   LFT Recent Labs    07/14/23 1535  PROT 6.6  ALBUMIN 3.2*  AST 18  ALT 18  ALKPHOS 84  BILITOT 0.5   PT/INR Recent Labs    07/14/23 1535  LABPROT 14.3  INR 1.1    Studies/Results: DG Chest Port 1 View Result Date: 07/14/2023 CLINICAL DATA:  Shortness of breath EXAM: PORTABLE CHEST 1 VIEW COMPARISON:  07/08/2023 FINDINGS: Possible trace pleural effusions. No consolidation. Retrocardiac opacity compatible with hiatal hernia. Normal cardiac size. No pneumothorax IMPRESSION: Possible trace pleural effusions. Hiatal hernia. Electronically Signed   By: Esmeralda Hedge M.D.   On: 07/14/2023 16:40    Impression: 68 year old male with history of gout, HTN, hypothyroidism, diabetes, GERD, H. pylori, adenomatous colon polyp, fatty liver, CHF with reduced EF of 40-45% found on echocardiogram this admission, who initially presented to the ER on 4/20 due to outpatient labs showing anemia.  In the ER, he was found to have a hemoglobin of 5.7 with microcytic indices.  GI consulted for further evaluation.  Acute symptomatic anemia with black stool: Hemoglobin 5.7, down from 14.1, 2 years ago.  Received 2 units PRBCs and Hgb improved to 7.7 today. Patient reports history of iron  deficiency anemia in the setting of small bowel AVMs, but has been doing well over the last several years taking "blood builders" on a daily basis.  He has had increasing shortness of breath over the last 2 weeks and also developed black stools, but this was also in the setting of starting oral iron  prescribed by his PCP for low hemoglobin.  He has no other significant GI symptoms.  Denies NSAIDs.Patient reports last EGD and colonoscopy about 5 years ago with Dr. Kimble Pennant, but these records are not available to me.  He also reports history of small bowel AVMs, but again I do not see's report aside from one in 2010 that was normal.   At this point, I would consider doing enteroscopy with colonoscopy to evaluate recurrent anemia.  Reports of black stool could be related to oral iron  versus PUD, AVMs in his upper GI  tract/small bowel, or right sided colon lesion.    CHF: New diagnosis. Echo this admission with EF 40-45%. CXR yesterday with possible trace pleural effusions. Has trace LE edema on exam. On room air with O2 saturation 98%. Received 1 dose of IV Lasix  20 mg today.    Plan: Iron  panel IV PPI BID.  Clear liquid diet today.  Complete Nulytely bowel prep today.  Proceed with enteroscopy and colonoscopy with propofol  by Dr. Sammi Crick tomorrow. The risks, benefits, and alternatives have been discussed with the patient in detail. The patient states understanding and desires to proceed.  NPO at midnight.  Continue to monitor H/H. Transfuse as necessary.    LOS: 1 day    07/15/2023, 8:58 AM   Shana Daring, PA-C [facility]

## 2023-07-15 NOTE — Progress Notes (Signed)
 PROGRESS NOTE    Calvin Green  GEX:[id number] DOB: 1955/12/08 DOA: 07/14/2023 PCP: Minus Amel, MD   Brief Narrative:    Calvin Green is a 68 y.o. male with medical history significant of hypertension, diabetes, obesity, fatty liver, fatty liver, gastritis with a history of GI bleeding in the past.  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  He is followed by Dr. Kimble Pennant with GI in [place].  Over the last week, he has had black stools which coincided with the time that he started his iron .  Overall, he has been feeling weak and fatigued and progressively dyspneic.  Patient went to his primary care physician who ordered some labs.  His labs returned Friday showing a hemoglobin of 5.9.  He came into the hospital due to his worsening symptoms and anemia.   Assessment & Plan:   Principal Problem:   Symptomatic anemia Active Problems:   Gastritis   Diabetes mellitus without complication (HCC)   Hypertension   Gout   Upper GI bleed   Elevated brain natriuretic peptide (BNP) level  Assessment and Plan:   Symptomatic anemia Status post 2 unit infusion with improvement Repeat CBC in the morning Gastritis Protonix  twice daily GI planning for upper and lower endoscopy pending 2D echocardiogram N.p.o. and advance diet per GI depending on timing of procedure Upper GI bleed Protonix  twice daily Diabetes Hold Janumet CBGs AC and nightly Hypertension Continue blood pressure control Elevated BNP Probably secondary to high-output failure Check echocardiogram Plan to give one-time dose of IV Lasix     DVT prophylaxis: SCDs Code Status: Full Family Communication: Wife at bedside 4/21 Disposition Plan:  Status is: Inpatient Remains inpatient appropriate because: Need for IV medications.   Consultants:  GI  Procedures:  None  Antimicrobials:  None   Subjective: Patient seen and evaluated today with no new acute complaints or concerns. No acute  concerns or events noted overnight.  Objective: Vitals:   07/15/23 0430 07/15/23 0445 07/15/23 0500 07/15/23 0615  BP: (!) 160/94 (!) 147/89 (!) 145/82   Pulse: 71 71 71 75  Resp: 16 16 19 15   Temp:    97.9 F (36.6 C)  TempSrc:      SpO2: 93% 91% 93% 95%  Weight:      Height:        Intake/Output Summary (Last 24 hours) at 07/15/2023 0728 Last data filed at 07/15/2023 0025 Gross per 24 hour  Intake 730 ml  Output 300 ml  Net 430 ml   Filed Weights   07/14/23 1341  Weight: 94.3 kg    Examination:  General exam: Appears calm and comfortable  Respiratory system: Clear to auscultation. Respiratory effort normal.  Minimal increase in work of breathing, room air Cardiovascular system: S1 & S2 heard, RRR.  Gastrointestinal system: Abdomen is soft Central nervous system: Alert and awake Extremities: No edema Skin: No significant lesions noted Psychiatry: Flat affect.    Data Reviewed: I have personally reviewed following labs and imaging studies  CBC: Recent Labs  Lab 07/14/23 1535 07/15/23 0228  WBC 7.9 9.0  NEUTROABS 5.9  --   HGB 5.7* 7.7*  HCT 24.2* 28.6*  MCV 69.1* 71.5*  PLT 453* 393   Basic Metabolic Panel: Recent Labs  Lab 07/14/23 1535 07/15/23 0228  NA 137 138  K 3.9 4.0  CL 105 107  CO2 22 22  GLUCOSE 102* 120*  BUN 12 14  CREATININE 1.05 0.99  CALCIUM 8.6*  8.4*   GFR: Estimated Creatinine Clearance: 84 mL/min (by C-G formula based on SCr of 0.99 mg/dL). Liver Function Tests: Recent Labs  Lab 07/14/23 1535  AST 18  ALT 18  ALKPHOS 84  BILITOT 0.5  PROT 6.6  ALBUMIN 3.2*   No results for input(s): "LIPASE", "AMYLASE" in the last 168 hours. No results for input(s): "AMMONIA" in the last 168 hours. Coagulation Profile: Recent Labs  Lab 07/14/23 1535  INR 1.1   Cardiac Enzymes: No results for input(s): "CKTOTAL", "CKMB", "CKMBINDEX", "TROPONINI" in the last 168 hours. BNP (last 3 results) No results for input(s): "PROBNP" in  the last 8760 hours. HbA1C: No results for input(s): "HGBA1C" in the last 72 hours. CBG: Recent Labs  Lab 07/14/23 2212  GLUCAP 118*   Lipid Profile: No results for input(s): "CHOL", "HDL", "LDLCALC", "TRIG", "CHOLHDL", "LDLDIRECT" in the last 72 hours. Thyroid Function Tests: No results for input(s): "TSH", "T4TOTAL", "FREET4", "T3FREE", "THYROIDAB" in the last 72 hours. Anemia Panel: No results for input(s): "VITAMINB12", "FOLATE", "FERRITIN", "TIBC", "IRON ", "RETICCTPCT" in the last 72 hours. Sepsis Labs: No results for input(s): "PROCALCITON", "LATICACIDVEN" in the last 168 hours.  No results found for this or any previous visit (from the past 240 hours).       Radiology Studies: DG Chest Port 1 View Result Date: 07/14/2023 CLINICAL DATA:  Shortness of breath EXAM: PORTABLE CHEST 1 VIEW COMPARISON:  07/08/2023 FINDINGS: Possible trace pleural effusions. No consolidation. Retrocardiac opacity compatible with hiatal hernia. Normal cardiac size. No pneumothorax IMPRESSION: Possible trace pleural effusions. Hiatal hernia. Electronically Signed   By: Esmeralda Hedge M.D.   On: 07/14/2023 16:40        Scheduled Meds:  sodium chloride    Intravenous Once   diltiazem   360 mg Oral QHS   insulin  aspart  0-15 Units Subcutaneous TID WC   insulin  aspart  0-5 Units Subcutaneous QHS   irbesartan   300 mg Oral Daily   levothyroxine   50 mcg Oral Daily   pantoprazole  (PROTONIX ) IV  40 mg Intravenous Q12H     LOS: 1 day    Time spent: 55 minutes    Shardae Kleinman Loran Rock, DO [facility]  If 7PM-7AM, please contact night-coverage [email or web address] 07/15/2023, 7:28 AM

## 2023-07-15 NOTE — ED Notes (Signed)
 Pt placed on a hospital bed for comfort. Wife remains at bedside.

## 2023-07-15 NOTE — Progress Notes (Signed)
*  PRELIMINARY RESULTS* Echocardiogram 2D Echocardiogram has been performed.  Bernis Brisker 07/15/2023, 11:08 AM

## 2023-07-15 NOTE — Progress Notes (Signed)
 Patient informed consent signed for enteroscopy and colonoscopy and placed in patients medical chart

## 2023-07-15 NOTE — ED Notes (Signed)
 Due to the pt's O2 dropping to 87% in RA while sleeping, the pt was placed on 2L [place]. Pt was advised he may need a sleeping test.

## 2023-07-15 NOTE — Progress Notes (Signed)
 Transition of Care Department [facility]) has reviewed patient and no other TOC needs have been identified at this time. We will continue to monitor patient advancement through interdisciplinary progression rounds. If new patient transition needs arise, please place a TOC consult.   07/15/23 0830  TOC Brief Assessment  Insurance and Status Reviewed  Patient has primary care physician Yes  Home environment has been reviewed Lives with wife.  Prior level of function: Independent.  Prior/Current Home Services No current home services  Social Drivers of Health Review SDOH reviewed no interventions necessary  Readmission risk has been reviewed Yes  Transition of care needs no transition of care needs at this time

## 2023-07-15 NOTE — ED Notes (Signed)
 Delay in giving medication due to pt sleeping. Pt did not fall asleep until after 0300.

## 2023-07-16 ENCOUNTER — Encounter (HOSPITAL_COMMUNITY): Payer: Self-pay | Admitting: Internal Medicine

## 2023-07-16 ENCOUNTER — Inpatient Hospital Stay (HOSPITAL_BASED_OUTPATIENT_CLINIC_OR_DEPARTMENT_OTHER)

## 2023-07-16 ENCOUNTER — Inpatient Hospital Stay (HOSPITAL_COMMUNITY)

## 2023-07-16 ENCOUNTER — Encounter (HOSPITAL_COMMUNITY)
Admission: EM | Disposition: A | Discharge status: Home / Self Care | Payer: Self-pay | Source: Home / Self Care | Attending: Emergency Medicine

## 2023-07-16 DIAGNOSIS — K635 Polyp of colon: Secondary | ICD-10-CM

## 2023-07-16 DIAGNOSIS — K449 Diaphragmatic hernia without obstruction or gangrene: Secondary | ICD-10-CM

## 2023-07-16 DIAGNOSIS — D124 Benign neoplasm of descending colon: Secondary | ICD-10-CM

## 2023-07-16 DIAGNOSIS — K5730 Diverticulosis of large intestine without perforation or abscess without bleeding: Secondary | ICD-10-CM

## 2023-07-16 DIAGNOSIS — K648 Other hemorrhoids: Secondary | ICD-10-CM

## 2023-07-16 DIAGNOSIS — K259 Gastric ulcer, unspecified as acute or chronic, without hemorrhage or perforation: Secondary | ICD-10-CM

## 2023-07-16 DIAGNOSIS — K921 Melena: Secondary | ICD-10-CM

## 2023-07-16 DIAGNOSIS — D649 Anemia, unspecified: Secondary | ICD-10-CM | POA: Diagnosis not present

## 2023-07-16 DIAGNOSIS — D509 Iron deficiency anemia, unspecified: Secondary | ICD-10-CM | POA: Diagnosis not present

## 2023-07-16 DIAGNOSIS — K3189 Other diseases of stomach and duodenum: Secondary | ICD-10-CM

## 2023-07-16 DIAGNOSIS — I10 Essential (primary) hypertension: Secondary | ICD-10-CM

## 2023-07-16 HISTORY — PX: COLONOSCOPY: SHX5424

## 2023-07-16 HISTORY — PX: ENTEROSCOPY: SHX5533

## 2023-07-16 LAB — CBC
HCT: 31 % — ABNORMAL LOW (ref 39.0–52.0)
Hemoglobin: 7.8 g/dL — ABNORMAL LOW (ref 13.0–17.0)
MCH: 18.4 pg — ABNORMAL LOW (ref 26.0–34.0)
MCHC: 25.2 g/dL — ABNORMAL LOW (ref 30.0–36.0)
MCV: 73.3 fL — ABNORMAL LOW (ref 80.0–100.0)
Platelets: 372 10*3/uL (ref 150–400)
RBC: 4.23 MIL/uL (ref 4.22–5.81)
RDW: 25.3 % — ABNORMAL HIGH (ref 11.5–15.5)
WBC: 8.3 10*3/uL (ref 4.0–10.5)
nRBC: 0 % (ref 0.0–0.2)

## 2023-07-16 LAB — BASIC METABOLIC PANEL WITH GFR
Anion gap: 12 (ref 5–15)
BUN: 12 mg/dL (ref 8–23)
CO2: 22 mmol/L (ref 22–32)
Calcium: 8.6 mg/dL — ABNORMAL LOW (ref 8.9–10.3)
Chloride: 106 mmol/L (ref 98–111)
Creatinine, Ser: 0.94 mg/dL (ref 0.61–1.24)
GFR, Estimated: >60 mL/min (ref >60)
Glucose, Bld: 105 mg/dL — ABNORMAL HIGH (ref 70–99)
Potassium: 3.4 mmol/L — ABNORMAL LOW (ref 3.5–5.1)
Sodium: 140 mmol/L (ref 135–145)

## 2023-07-16 LAB — OCCULT BLOOD X 1 CARD TO LAB, STOOL: Fecal Occult Bld: NEGATIVE

## 2023-07-16 LAB — MAGNESIUM: Magnesium: 1.7 mg/dL (ref 1.7–2.4)

## 2023-07-16 LAB — GLUCOSE, CAPILLARY
Glucose-Capillary: 111 mg/dL — ABNORMAL HIGH (ref 70–99)
Glucose-Capillary: 87 mg/dL (ref 70–99)
Glucose-Capillary: 70 mg/dL (ref 70–99)
Glucose-Capillary: 130 mg/dL — ABNORMAL HIGH (ref 70–99)

## 2023-07-16 LAB — IRON AND TIBC
Iron: 15 ug/dL — ABNORMAL LOW (ref 45–182)
Saturation Ratios: 4 % — ABNORMAL LOW (ref 17.9–39.5)
TIBC: 372 ug/dL (ref 250–450)
UIBC: 357 ug/dL

## 2023-07-16 LAB — FERRITIN: Ferritin: 38 ng/mL (ref 24–336)

## 2023-07-16 SURGERY — ENTEROSCOPY
Anesthesia: General

## 2023-07-16 MED ORDER — GLUCAGON HCL RDNA (DIAGNOSTIC) 1 MG IJ SOLR
INTRAMUSCULAR | Status: DC | PRN
  Administered 2023-07-16: .25 mg via INTRAVENOUS

## 2023-07-16 MED ORDER — STERILE WATER FOR IRRIGATION IR SOLN
Status: DC | PRN
  Administered 2023-07-16: 60 mL

## 2023-07-16 MED ORDER — PANTOPRAZOLE SODIUM 40 MG PO TBEC: 40.0000 mg | DELAYED_RELEASE_TABLET | Freq: Two times a day (BID) | ORAL | 1 refills | Status: AC

## 2023-07-16 MED ORDER — PANTOPRAZOLE SODIUM 40 MG PO TBEC: 40.0000 mg | DELAYED_RELEASE_TABLET | Freq: Two times a day (BID) | ORAL | 1 refills | Status: DC

## 2023-07-16 MED ORDER — SODIUM CHLORIDE 0.9 % IV SOLN
100.0000 mg | Freq: Once | INTRAVENOUS | Status: AC
  Administered 2023-07-16: 100 mg via INTRAVENOUS
  Filled 2023-07-16: qty 5

## 2023-07-16 MED ORDER — DEXTROSE 50 % IV SOLN
INTRAVENOUS | Status: AC
Start: 2023-07-16 — End: 2023-07-16
  Filled 2023-07-16: qty 50

## 2023-07-16 MED ORDER — LIDOCAINE HCL (PF) 2 % IJ SOLN
INTRAMUSCULAR | Status: DC | PRN
Start: 2023-07-16 — End: 2023-07-16
  Administered 2023-07-16: 100 mg via INTRADERMAL

## 2023-07-16 MED ORDER — FUROSEMIDE 20 MG PO TABS: 20.0000 mg | ORAL_TABLET | Freq: Every day | ORAL | 1 refills | Status: AC | PRN

## 2023-07-16 MED ORDER — PROPOFOL 500 MG/50ML IV EMUL
INTRAVENOUS | Status: DC | PRN
  Administered 2023-07-16: 150 ug/kg/min via INTRAVENOUS

## 2023-07-16 MED ORDER — PROPOFOL 10 MG/ML IV BOLUS
INTRAVENOUS | Status: DC | PRN
  Administered 2023-07-16: 100 mg via INTRAVENOUS

## 2023-07-16 MED ORDER — DEXTROSE 50 % IV SOLN
INTRAVENOUS | Status: DC | PRN
Start: 2023-07-16 — End: 2023-07-16
  Administered 2023-07-16: 12.5 g via INTRAVENOUS

## 2023-07-16 MED ORDER — GLUCAGON HCL RDNA (DIAGNOSTIC) 1 MG IJ SOLR
INTRAMUSCULAR | Status: AC
  Filled 2023-07-16: qty 1

## 2023-07-16 MED ORDER — POTASSIUM CHLORIDE CRYS ER 20 MEQ PO TBCR
40.0000 meq | EXTENDED_RELEASE_TABLET | Freq: Once | ORAL | Status: AC
  Administered 2023-07-16: 40 meq via ORAL
  Filled 2023-07-16: qty 2

## 2023-07-16 MED ORDER — LACTATED RINGERS IV SOLN: INTRAVENOUS | Status: DC

## 2023-07-16 NOTE — Anesthesia Preprocedure Evaluation (Signed)
 Anesthesia Evaluation  Patient identified by MRN, date of birth, ID band Patient awake    Reviewed: Allergy & Precautions, H&P , NPO status , Patient's Chart, lab work & pertinent test results, reviewed documented beta blocker date and time   Airway Mallampati: II  TM Distance: >3 FB Neck ROM: full    Dental no notable dental hx.    Pulmonary neg pulmonary ROS   Pulmonary exam normal breath sounds clear to auscultation       Cardiovascular Exercise Tolerance: Good hypertension,  Rhythm:regular Rate:Normal     Neuro/Psych negative neurological ROS  negative psych ROS   GI/Hepatic negative GI ROS, Neg liver ROS,,,  Endo/Other  diabetes    Renal/GU negative Renal ROS  negative genitourinary   Musculoskeletal   Abdominal   Peds  Hematology  (+) Blood dyscrasia, anemia   Anesthesia Other Findings   Reproductive/Obstetrics negative OB ROS                             Anesthesia Physical Anesthesia Plan  ASA: 4 and emergent  Anesthesia Plan: General   Post-op Pain Management:    Induction:   PONV Risk Score and Plan: Propofol  infusion  Airway Management Planned:   Additional Equipment:   Intra-op Plan:   Post-operative Plan:   Informed Consent: I have reviewed the patients History and Physical, chart, labs and discussed the procedure including the risks, benefits and alternatives for the proposed anesthesia with the patient or authorized representative who has indicated his/her understanding and acceptance.     Dental Advisory Given  Plan Discussed with: CRNA  Anesthesia Plan Comments:        Anesthesia Quick Evaluation

## 2023-07-16 NOTE — Op Note (Addendum)
 [facility] Patient Name: Calvin Green Procedure Date: 07/16/2023 1:42 PM MRN: [id number] Date of Birth: 1955/06/24 Attending MD: Samantha Cress , , [id number] CSN: [id number] Age: 68 Admit Type: Inpatient Procedure:                Small bowel enteroscopy Indications:              Iron  deficiency anemia Providers:                Samantha Cress, Crystal Page, Sharlette Dayhoff                            Technician, Technician Referring MD:              Medicines:                Monitored Anesthesia Care Complications:            No immediate complications. Estimated Blood Loss:     Estimated blood loss: none. Procedure:                Pre-Anesthesia Assessment:                           - Prior to the procedure, a History and Physical                            was performed, and patient medications, allergies                            and sensitivities were reviewed. The patient's                            tolerance of previous anesthesia was reviewed.                           - The risks and benefits of the procedure and the                            sedation options and risks were discussed with the                            patient. All questions were answered and informed                            consent was obtained.                           - ASA Grade Assessment: II - A patient with mild                            systemic disease.                           After obtaining informed consent, the endoscope was                            passed under direct vision. Throughout the  procedure, the patient's blood pressure, pulse, and                            oxygen saturations were monitored continuously. The                            [id number]-[id number] ([id number]) scope was introduced through the                            mouth and advanced to the second part of duodenum.                            The small bowel enteroscopy was accomplished                             without difficulty. The patient tolerated the                            procedure well. The [id number]) scope was                            introduced through the mouth and advanced to the                            proximal jejunum. Scope In: 2:23:50 PM Scope Out: 2:38:08 PM Total Procedure Duration: 0 hours 14 minutes 18 seconds  Findings:      A 4 cm hiatal hernia was present.      Three non-bleeding cratered gastric ulcers with a clean ulcer base       (Forrest Class III) were found in the gastric antrum. The largest lesion       was 8 mm in largest dimension. Biopsies from the edge were taken with a       cold forceps for histology. Biopsies were taken with a cold forceps for       Helicobacter pylori testing.      The examined duodenum was normal.      There was no evidence of significant pathology in the entire examined       portion of jejunum. Impression:               - 4 cm hiatal hernia.                           - Non-bleeding gastric ulcers with a clean ulcer                            base (Forrest Class III). Biopsied.                           - Normal examined duodenum.                           - The examined portion of the jejunum was normal. Moderate Sedation:      Per Anesthesia Care Recommendation:           - Return patient to hospital ward for ongoing  care.                           - Resume previous diet.                           - Use Protonix  (pantoprazole ) 40 mg PO BID.                           - Repeat the small bowel enteroscopy in 3 months                            for surveillance.                           - Await pathology results.                           - Proceed with IV iron  infusion during current                            hospitalization.                           - Follow up with Dr. Kimble Pennant upon discharge. Procedure Code(s):        --- Professional ---                           [phone], Small intestinal  endoscopy, enteroscopy                            beyond second portion of duodenum, not including                            ileum; with biopsy, single or multiple Diagnosis Code(s):        --- Professional ---                           K44.9, Diaphragmatic hernia without obstruction or                            gangrene                           K25.9, Gastric ulcer, unspecified as acute or                            chronic, without hemorrhage or perforation                           D50.9, Iron  deficiency anemia, unspecified CPT copyright 2022 [employer]. All rights reserved. The codes documented in this report are preliminary and upon coder review may  be revised to meet current compliance requirements. Samantha Cress, MD Samantha Cress,  07/16/2023 2:44:54 PM This report has been signed electronically. Number of Addenda: 0

## 2023-07-16 NOTE — Discharge Summary (Addendum)
 Physician Discharge Summary  Calvin Green:[id number] DOB: 27-Nov-1955 DOA: 07/14/2023  PCP: Minus Amel, MD  Admit date: 07/14/2023  Discharge date: 07/16/2023  Admitted From:Home  Disposition:  Home  Recommendations for Outpatient Follow-up:  Follow up with PCP in 1-2 weeks and repeat CBC in 1 week Follow-up with cardiology outpatient to evaluate chronic diastolic/systolic heart failure which is a new finding.  Referral has been sent Follow-up with gastroenterologist Dr. Kimble Pennant at [place] with referral sent Continue home iron  supplementation, given IV iron  infusion while inpatient Continue PPI twice daily as prescribed Lasix  given to take as needed for any worsening shortness of breath or edema. Continue other home medications as prior  Home Health: None  Equipment/Devices: None  Discharge Condition:Stable  CODE STATUS: Full  Diet recommendation: Heart Healthy/carb modified  Brief/Interim Summary: Calvin Green is a 68 y.o. male with medical history significant of hypertension, diabetes, obesity, fatty liver, fatty liver, gastritis with a history of GI bleeding in the past.  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  He is followed by Dr. Kimble Pennant with GI in [place].  Over the last week, he has had black stools which coincided with the time that he started his iron .  Overall, he has been feeling weak and fatigued and progressively dyspneic.  Patient went to his primary care physician who ordered some labs.  His labs returned Friday showing a hemoglobin of 5.9.  He came into the hospital due to his worsening symptoms and anemia.  He has required a 2 unit PRBC transfusion with improvement in hemoglobin levels which have now remained stable.  He has undergone upper and lower endoscopy per GI here with no acute findings of bleeding noted and is deemed stable for discharge at this point per GI service.  He is now able to tolerate diet and will have iron  infusion  prior to discharge.  No other acute events or concerns otherwise noted.  Discharge Diagnoses:  Principal Problem:   Symptomatic anemia Active Problems:   Gastritis   Diabetes mellitus without complication (HCC)   Hypertension   Gout   Upper GI bleed   Elevated brain natriuretic peptide (BNP) level  Principal discharge diagnosis: Symptomatic anemia status post 2 unit PRBC transfusion with noted nonbleeding gastric ulcers and diverticulosis along with nonbleeding internal hemorrhoids.  Discharge Instructions  Discharge Instructions     Ambulatory referral to Cardiology   Complete by: As directed    Ambulatory referral to Gastroenterology   Complete by: As directed    What is the reason for referral?: Other   Diet - low sodium heart healthy   Complete by: As directed    Increase activity slowly   Complete by: As directed       Allergies as of 07/16/2023   No Known Allergies      Medication List     STOP taking these medications    NEXIUM 24HR PO       TAKE these medications    acetaminophen  650 MG CR tablet Commonly known as: TYLENOL  Take 650 mg by mouth every 8 (eight) hours as needed (headache).   ALIGN PO Take 1 capsule by mouth at bedtime.   colchicine 0.6 MG tablet Take 2 tablets by mouth daily as needed (gout).   diltiazem  360 MG 24 hr tablet Commonly known as: CARDIZEM  LA Take 360 mg by mouth at bedtime.   FeroSul 325 (65 Fe) MG tablet Generic drug: ferrous sulfate Take 325 mg  by mouth 3 (three) times daily.   furosemide  20 MG tablet Commonly known as: Lasix  Take 1 tablet (20 mg total) by mouth daily as needed for fluid or edema (shortness of breath).   IRON  PO Take 2 tablets by mouth in the morning and at bedtime. [employer] Blood Builder   Janumet 50-1000 MG tablet Generic drug: sitaGLIPtin-metformin Take 1 tablet by mouth 2 (two) times daily.   levothyroxine  50 MCG tablet Commonly known as: SYNTHROID  Take 50 mcg by mouth daily.    olmesartan 40 MG tablet Commonly known as: BENICAR Take 40 mg by mouth daily.   pantoprazole  40 MG tablet Commonly known as: Protonix  Take 1 tablet (40 mg total) by mouth 2 (two) times daily.        Follow-up Information     Minus Amel, MD. Schedule an appointment as soon as possible for a visit in 1 week(s).   Specialty: Family Medicine Contact information: [address] [place] [address] [phone]         Evangeline Hilts, MD. Go to.   Specialty: Gastroenterology Contact information: [address] [place] [address] [phone]         [facility] Selene Dais. Go to.   Specialty: Cardiology Contact information: [address] [place]  [phone]               No Known Allergies  Consultations: GI   Procedures/Studies: ECHOCARDIOGRAM COMPLETE Result Date: 07/15/2023    ECHOCARDIOGRAM REPORT   Patient Name:   Calvin Green Date of Exam: 07/15/2023 Medical Rec #:  [id number]      Height:       72.0 in Accession #:    [id number]     Weight:       208.0 lb Date of Birth:  February 26, 1956      BSA:          2.166 m Patient Age:    67 years       BP:           160/97 mmHg Patient Gender: M              HR:           82 bpm. Exam Location:  [facility] Procedure: 2D Echo, 3D Echo, Cardiac Doppler, Color Doppler and Strain Analysis            (Both Spectral and Color Flow Doppler were utilized during            procedure). STAT ECHO Indications:    CHF l50.9  History:        Patient has no prior history of Echocardiogram examinations.                 Risk Factors:Diabetes and Hypertension. Elevated brain                 natriuretic peptide (BNP) level.  Sonographer:    Denese Finn RCS Referring Phys: [id number] Dawanda Mapel D [facility]  Sonographer Comments: Global longitudinal strain was attempted. IMPRESSIONS  1. Left ventricular ejection fraction, by estimation, is 40 to 45%. The left ventricle has mildly decreased  function. The left ventricle demonstrates global hypokinesis. Left ventricular diastolic parameters are indeterminate. Elevated left atrial pressure.  2. Right ventricular systolic function is low normal. The right ventricular size is mildly enlarged. Tricuspid regurgitation signal is inadequate for assessing PA pressure.  3. Left atrial size was moderately dilated.  4. The mitral valve  is normal in structure. Trivial mitral valve regurgitation. No evidence of mitral stenosis.  5. The aortic valve is tricuspid. Aortic valve regurgitation is not visualized. No aortic stenosis is present.  6. The inferior vena cava is dilated in size with <50% respiratory variability, suggesting right atrial pressure of 15 mmHg. FINDINGS  Left Ventricle: Left ventricular ejection fraction, by estimation, is 40 to 45%. The left ventricle has mildly decreased function. The left ventricle demonstrates global hypokinesis. Strain was performed and the global longitudinal strain is indeterminate. The left ventricular internal cavity size was normal in size. There is no left ventricular hypertrophy. Left ventricular diastolic parameters are indeterminate. Elevated left atrial pressure. Right Ventricle: The right ventricular size is mildly enlarged. Right vetricular wall thickness was not well visualized. Right ventricular systolic function is low normal. Tricuspid regurgitation signal is inadequate for assessing PA pressure. Left Atrium: Left atrial size was moderately dilated. Right Atrium: Right atrial size was normal in size. Pericardium: There is no evidence of pericardial effusion. Mitral Valve: The mitral valve is normal in structure. Trivial mitral valve regurgitation. No evidence of mitral valve stenosis. MV peak gradient, 6.7 mmHg. The mean mitral valve gradient is 2.0 mmHg. Tricuspid Valve: The tricuspid valve is normal in structure. Tricuspid valve regurgitation is trivial. No evidence of tricuspid stenosis. Aortic Valve: The  aortic valve is tricuspid. Aortic valve regurgitation is not visualized. No aortic stenosis is present. Aortic valve mean gradient measures 4.0 mmHg. Aortic valve peak gradient measures 6.7 mmHg. Aortic valve area, by VTI measures 1.91 cm. Pulmonic Valve: The pulmonic valve was not well visualized. Pulmonic valve regurgitation is not visualized. No evidence of pulmonic stenosis. Aorta: The aortic root is normal in size and structure and the ascending aorta was not well visualized. Venous: The inferior vena cava is dilated in size with less than 50% respiratory variability, suggesting right atrial pressure of 15 mmHg. IAS/Shunts: No atrial level shunt detected by color flow Doppler. Additional Comments: 3D was performed not requiring image post processing on an independent workstation and was abnormal.  LEFT VENTRICLE PLAX 2D LVIDd:         6.30 cm   Diastology LVIDs:         5.20 cm   LV e' medial:    5.55 cm/s LV PW:         1.00 cm   LV E/e' medial:  22.2 LV IVS:        0.90 cm   LV e' lateral:   11.10 cm/s LVOT diam:     2.00 cm   LV E/e' lateral: 11.1 LV SV:         62 LV SV Index:   29 LVOT Area:     3.14 cm                           3D Volume EF:                          3D EF:        44 %                          LV EDV:       259 ml                          LV ESV:  145 ml                          LV SV:        113 ml RIGHT VENTRICLE RV S prime:     15.10 cm/s TAPSE (M-mode): 2.0 cm LEFT ATRIUM              Index        RIGHT ATRIUM           Index LA diam:        4.10 cm  1.89 cm/m   RA Area:     20.40 cm LA Vol (A2C):   110.0 ml 50.78 ml/m  RA Volume:   67.40 ml  31.11 ml/m LA Vol (A4C):   90.5 ml  41.77 ml/m LA Biplane Vol: 106.0 ml 48.93 ml/m  AORTIC VALVE AV Area (Vmax):    2.14 cm AV Area (Vmean):   2.29 cm AV Area (VTI):     1.91 cm AV Vmax:           129.00 cm/s AV Vmean:          87.900 cm/s AV VTI:            0.324 m AV Peak Grad:      6.7 mmHg AV Mean Grad:      4.0 mmHg LVOT Vmax:          87.90 cm/s LVOT Vmean:        64.000 cm/s LVOT VTI:          0.197 m LVOT/AV VTI ratio: 0.61  AORTA Ao Root diam: 3.70 cm MITRAL VALVE MV Area (PHT): 5.84 cm     SHUNTS MV Area VTI:   1.88 cm     Systemic VTI:  0.20 m MV Peak grad:  6.7 mmHg     Systemic Diam: 2.00 cm MV Mean grad:  2.0 mmHg MV Vmax:       1.29 m/s MV Vmean:      59.0 cm/s MV Decel Time: 130 msec MV E velocity: 123.00 cm/s MV A velocity: 97.00 cm/s MV E/A ratio:  1.27 Armida Lander MD Electronically signed by Armida Lander MD Signature Date/Time: 07/15/2023/11:21:40 AM    Final    DG Chest Port 1 View Result Date: 07/14/2023 CLINICAL DATA:  Shortness of breath EXAM: PORTABLE CHEST 1 VIEW COMPARISON:  07/08/2023 FINDINGS: Possible trace pleural effusions. No consolidation. Retrocardiac opacity compatible with hiatal hernia. Normal cardiac size. No pneumothorax IMPRESSION: Possible trace pleural effusions. Hiatal hernia. Electronically Signed   By: Esmeralda Hedge M.D.   On: 07/14/2023 16:40   DG Chest 2 View Result Date: 07/08/2023 CLINICAL DATA:  Several day history of increasing shortness of breath. EXAM: CHEST - 2 VIEW COMPARISON:  None Available. FINDINGS: Cardiac silhouette is normal in size and configuration. Moderate size hiatal hernia. No mediastinal or hilar masses. No evidence of adenopathy. Small bilateral pleural effusions. There are streaky opacities in the posterior lung bases consistent with atelectasis. Remainder of the lungs is clear. No pneumothorax. Skeletal structures are intact. IMPRESSION: 1. Small bilateral pleural effusions with associated atelectasis. No convincing pneumonia and no evidence of pulmonary edema. 2. Moderate hiatal hernia. Electronically Signed   By: Amanda Jungling M.D.   On: 07/08/2023 15:31     Discharge Exam: Vitals:   07/16/23 1445 07/16/23 1454  BP: 125/79   Pulse: 74 76  Resp: 12 18  Temp:  (!) 97.4 F (  36.3 C)  SpO2: 93% 95%   Vitals:   07/16/23 1442 07/16/23 1444 07/16/23  1445 07/16/23 1454  BP: 120/66 125/79 125/79   Pulse: 76 76 74 76  Resp: 11 12 12 18   Temp: (!) 96.8 F (36 C)   (!) 97.4 F (36.3 C)  TempSrc:      SpO2: (!) 89% (!) 88% 93% 95%  Weight:      Height:        General: Pt is alert, awake, not in acute distress Cardiovascular: RRR, S1/S2 +, no rubs, no gallops Respiratory: CTA bilaterally, no wheezing, no rhonchi Abdominal: Soft, NT, ND, bowel sounds + Extremities: no edema, no cyanosis    The results of significant diagnostics from this hospitalization (including imaging, microbiology, ancillary and laboratory) are listed below for reference.     Microbiology: No results found for this or any previous visit (from the past 240 hours).   Labs: BNP (last 3 results) Recent Labs    07/14/23 1535  BNP 332.0*   Basic Metabolic Panel: Recent Labs  Lab 07/14/23 1535 07/15/23 0228 07/16/23 0405  NA 137 138 140  K 3.9 4.0 3.4*  CL 105 107 106  CO2 22 22 22   GLUCOSE 102* 120* 105*  BUN 12 14 12   CREATININE 1.05 0.99 0.94  CALCIUM 8.6* 8.4* 8.6*  MG  --   --  1.7   Liver Function Tests: Recent Labs  Lab 07/14/23 1535  AST 18  ALT 18  ALKPHOS 84  BILITOT 0.5  PROT 6.6  ALBUMIN 3.2*   No results for input(s): "LIPASE", "AMYLASE" in the last 168 hours. No results for input(s): "AMMONIA" in the last 168 hours. CBC: Recent Labs  Lab 07/14/23 1535 07/15/23 0228 07/16/23 0405  WBC 7.9 9.0 8.3  NEUTROABS 5.9  --   --   HGB 5.7* 7.7* 7.8*  HCT 24.2* 28.6* 31.0*  MCV 69.1* 71.5* 73.3*  PLT 453* 393 372   Cardiac Enzymes: No results for input(s): "CKTOTAL", "CKMB", "CKMBINDEX", "TROPONINI" in the last 168 hours. BNP: Invalid input(s): "POCBNP" CBG: Recent Labs  Lab 07/15/23 1952 07/16/23 0755 07/16/23 1108 07/16/23 1354 07/16/23 1444  GLUCAP 113* 111* 87 70 130*   D-Dimer No results for input(s): "DDIMER" in the last 72 hours. Hgb A1c Recent Labs    07/15/23 0228  HGBA1C 5.4   Lipid Profile No  results for input(s): "CHOL", "HDL", "LDLCALC", "TRIG", "CHOLHDL", "LDLDIRECT" in the last 72 hours. Thyroid function studies No results for input(s): "TSH", "T4TOTAL", "T3FREE", "THYROIDAB" in the last 72 hours.  Invalid input(s): "FREET3" Anemia work up Recent Labs    07/16/23 0405  FERRITIN 38  TIBC 372  IRON  15*   Urinalysis    Component Value Date/Time   COLORURINE YELLOW 03/11/2008 1438   APPEARANCEUR CLEAR 03/11/2008 1438   LABSPEC 1.015 03/11/2008 1438   PHURINE 5.5 03/11/2008 1438   GLUCOSEU NEGATIVE 03/11/2008 1438   HGBUR NEGATIVE 03/11/2008 1438   BILIRUBINUR NEGATIVE 03/11/2008 1438   KETONESUR NEGATIVE 03/11/2008 1438   PROTEINUR NEGATIVE 03/11/2008 1438   UROBILINOGEN 0.2 03/11/2008 1438   NITRITE NEGATIVE 03/11/2008 1438   LEUKOCYTESUR  03/11/2008 1438    NEGATIVE MICROSCOPIC NOT DONE ON URINES WITH NEGATIVE PROTEIN, BLOOD, LEUKOCYTES, NITRITE, OR GLUCOSE <1000 mg/dL.   Sepsis Labs Recent Labs  Lab 07/14/23 1535 07/15/23 0228 07/16/23 0405  WBC 7.9 9.0 8.3   Microbiology No results found for this or any previous visit (from the past 240 hours).  Time coordinating discharge: 35 minutes  SIGNED:   Cornelius Dill, DO [facility] 07/16/2023, 4:39 PM  If 7PM-7AM, please contact night-coverage [email or web address]

## 2023-07-16 NOTE — Progress Notes (Signed)
   07/16/23 1035  Spiritual Encounters  Type of Visit Initial  Care provided to: Patient  Referral source Other (comment) (Spiritual Consult)  Reason for visit Advance directives  OnCall Visit No   Chaplain responded to a spiritual consult for advanced directive education. I met with the patient, Calvin Green who was alert and welcomed me into his space. Calvin Green shared that he had all his paperwork in order already and did not have a need for an update. I visited with Calvin Green and his family member before I departed.   Clarence Croak [facility]  [phone]

## 2023-07-16 NOTE — Care Management Important Message (Signed)
 Important Message  Patient Details  Name: Calvin Green MRN: [id number] Date of Birth: [birth year]   Important Message Given:  Yes - Medicare IM     Jenea Dake L Maxmillian Carsey 07/16/2023, 11:14 AM

## 2023-07-16 NOTE — Care Management CC44 (Signed)
 Condition Code 44 Documentation Completed  Patient Details  Name: ADHVIK CANADY MRN: [id number] Date of Birth: [birth year]   Condition Code 44 given:  Yes Patient signature on Condition Code 44 notice:  Yes Documentation of 2 MD's agreement:  Yes Code 44 added to claim:  Yes    Geraldina Klinefelter, RN 07/16/2023, 4:16 PM

## 2023-07-16 NOTE — Care Management Obs Status (Signed)
 MEDICARE OBSERVATION STATUS NOTIFICATION   Patient Details  Name: Calvin Green MRN: [id number] Date of Birth: 1955/12/09   Medicare Observation Status Notification Given:  Yes    Geraldina Klinefelter, RN 07/16/2023, 4:16 PM

## 2023-07-16 NOTE — Brief Op Note (Signed)
 07/14/2023 - 07/16/2023  2:19 PM  PATIENT:  Calvin Green  68 y.o. male  PRE-OPERATIVE DIAGNOSIS:  acute anemia, black stool, history of AVMs, hyperplastic gastric polyps, adenomatous colon polyps  POST-OPERATIVE DIAGNOSIS:  COLON: diverticulosis; descending colon polyp (CS); hemorrhoids EGD:  PROCEDURE:  Procedure(s): ENTEROSCOPY (N/A) COLONOSCOPY (N/A)  SURGEON:  Surgeons and Role:    * Urban Garden, MD - Primary  Patient underwent EGD/COLONOSCOPY  under propofol  sedation.  Tolerated the procedure adequately.   EGD FINDINGS: - 4 cm hiatal hernia.  - Non-bleeding gastric ulcers with a clean ulcer base (Forrest Class III).  Biopsied.  - Normal examined duodenum.  - The examined portion of the jejunum was normal.   COLONOSCOPY FINDINGS: - The examined portion of the ileum was normal.  - Diverticulosis in the sigmoid colon.  - One 3 mm polyp in the descending colon, removed with a cold snare.  Resected and retrieved.  - Non-bleeding internal hemorrhoids.   RECOMMENDATIONS - Return patient to hospital ward for ongoing care.  - Resume previous diet.  - Use Protonix  (pantoprazole ) 40 mg PO BID.  - Repeat the small bowel enteroscopy in 3 months for surveillance.  - Await pathology results.  - Proceed with IV iron  infusion during current hospitalization. - Follow up with Dr. Kimble Pennant upon discharge. - GI service will sign-off, please call us  back if you have any more questions.  Samantha Cress, MD Gastroenterology and Hepatology [facility] Gastroenterology

## 2023-07-16 NOTE — Plan of Care (Signed)

## 2023-07-16 NOTE — Transfer of Care (Signed)
 Immediate Anesthesia Transfer of Care Note  Patient: Calvin Green  Procedure(s) Performed: ENTEROSCOPY COLONOSCOPY  Patient Location: PACU  Anesthesia Type:General  Level of Consciousness: drowsy  Airway & Oxygen Therapy: Patient Spontanous Breathing and Patient connected to nasal cannula oxygen  Post-op Assessment: Report given to RN and Post -op Vital signs reviewed and stable  Post vital signs: Reviewed and stable  Last Vitals:  Vitals Value Taken Time  BP 120/66 07/16/23 1442  Temp 96.8   Pulse 76 07/16/23 1443  Resp 12 07/16/23 1443  SpO2 88 % 07/16/23 1443  Vitals shown include unfiled device data.  Last Pain:  Vitals:   07/16/23 1351  TempSrc:   PainSc: 0-No pain      Patients Stated Pain Goal: 5 (07/16/23 1131)  Complications: No notable events documented.

## 2023-07-16 NOTE — Progress Notes (Signed)
 We will proceed with Egd and colonoscopy as scheduled.  I thoroughly discussed with the patient the procedure, including the risks involved. Patient understands what the procedure involves including the benefits and any risks. Patient understands alternatives to the proposed procedure. Risks including (but not limited to) bleeding, tearing of the lining (perforation), rupture of adjacent organs, problems with heart and lung function, infection, and medication reactions. A small percentage of complications may require surgery, hospitalization, repeat endoscopic procedure, and/or transfusion.  Patient understood and agreed.  Katrinka Blazing, MD Gastroenterology and Hepatology [facility] Gastroenterology

## 2023-07-16 NOTE — Anesthesia Procedure Notes (Signed)
 Date/Time: 07/16/2023 1:50 PM  Performed by: Sherwin Donate, CRNAPre-anesthesia Checklist: Patient identified, Emergency Drugs available, Suction available and Patient being monitored Patient Re-evaluated:Patient Re-evaluated prior to induction Oxygen Delivery Method: Nasal cannula Induction Type: IV induction Placement Confirmation: positive ETCO2 Comments: Optiflow High Flow [place] O2 used.

## 2023-07-16 NOTE — Progress Notes (Signed)
 Tap water  enema given , pt output was clear yellow tinged liquid without any solid stools.

## 2023-07-16 NOTE — Op Note (Signed)
 [facility] Patient Name: Calvin Green Procedure Date: 07/16/2023 1:42 PM MRN: [id number] Date of Birth: 26-Feb-1956 Attending MD: Samantha Cress , , [id number] CSN: [id number] Age: 68 Admit Type: Inpatient Procedure:                Colonoscopy Indications:              Iron  deficiency anemia Providers:                Samantha Cress, Crystal Page, Sharlette Dayhoff                            Technician, Technician Referring MD:              Medicines:                Monitored Anesthesia Care Complications:            No immediate complications. Estimated Blood Loss:     Estimated blood loss: none. Procedure:                Pre-Anesthesia Assessment:                           - Prior to the procedure, a History and Physical                            was performed, and patient medications, allergies                            and sensitivities were reviewed. The patient's                            tolerance of previous anesthesia was reviewed.                           - The risks and benefits of the procedure and the                            sedation options and risks were discussed with the                            patient. All questions were answered and informed                            consent was obtained.                           - ASA Grade Assessment: II - A patient with mild                            systemic disease.                           After obtaining informed consent, the colonoscope                            was passed under direct vision. Throughout the  procedure, the patient's blood pressure, pulse, and                            oxygen saturations were monitored continuously. The                            [id number]-HQ190L ([id number]) scope was introduced through                            the anus and advanced to the the terminal ileum.                            The colonoscopy was performed without difficulty.                             The patient tolerated the procedure well. The                            quality of the bowel preparation was adequate. Scope In: 1:55:29 PM Scope Out: 2:18:44 PM Scope Withdrawal Time: 0 hours 15 minutes 9 seconds  Total Procedure Duration: 0 hours 23 minutes 15 seconds  Findings:      The perianal and digital rectal examinations were normal.      The terminal ileum appeared normal.      A few small-mouthed diverticula were found in the sigmoid colon.      A 3 mm polyp was found in the descending colon. The polyp was sessile.       The polyp was removed with a cold snare. Resection and retrieval were       complete.      Non-bleeding internal hemorrhoids were found during retroflexion. The       hemorrhoids were small. Impression:               - The examined portion of the ileum was normal.                           - Diverticulosis in the sigmoid colon.                           - One 3 mm polyp in the descending colon, removed                            with a cold snare. Resected and retrieved.                           - Non-bleeding internal hemorrhoids. Moderate Sedation:      Per Anesthesia Care Recommendation:           Proceed with enteroscopy                           - Await pathology results.                           - Repeat colonoscopy for surveillance based on  pathology results. Procedure Code(s):        --- Professional ---                           [phone], Colonoscopy, flexible; with removal of                            tumor(s), polyp(s), or other lesion(s) by snare                            technique Diagnosis Code(s):        --- Professional ---                           K64.8, Other hemorrhoids                           D12.4, Benign neoplasm of descending colon                           D50.9, Iron  deficiency anemia, unspecified                           K57.30, Diverticulosis of large intestine without                             perforation or abscess without bleeding CPT copyright 2022 American Medical Association. All rights reserved. The codes documented in this report are preliminary and upon coder review may  be revised to meet current compliance requirements. Samantha Cress, MD Samantha Cress,  07/16/2023 2:40:23 PM This report has been signed electronically. Number of Addenda: 0

## 2023-07-17 ENCOUNTER — Encounter (HOSPITAL_COMMUNITY): Payer: Self-pay | Admitting: Gastroenterology

## 2023-07-18 LAB — SURGICAL PATHOLOGY

## 2023-07-19 NOTE — Anesthesia Postprocedure Evaluation (Signed)
 Anesthesia Post Note  Patient: Calvin Green  Procedure(s) Performed: ENTEROSCOPY COLONOSCOPY  Patient location during evaluation: Phase II Anesthesia Type: General Level of consciousness: awake Pain management: pain level controlled Vital Signs Assessment: post-procedure vital signs reviewed and stable Respiratory status: spontaneous breathing and respiratory function stable Cardiovascular status: blood pressure returned to baseline and stable Postop Assessment: no headache and no apparent nausea or vomiting Anesthetic complications: no Comments: Late entry   No notable events documented.   Last Vitals:  Vitals:   07/16/23 1445 07/16/23 1454  BP: 125/79   Pulse: 74 76  Resp: 12 18  Temp:  (!) 36.3 C  SpO2: 93% 95%    Last Pain:  Vitals:   07/16/23 1442  TempSrc:   PainSc: Asleep                 Coretha Dew

## 2023-07-22 ENCOUNTER — Encounter (INDEPENDENT_AMBULATORY_CARE_PROVIDER_SITE_OTHER): Payer: Self-pay | Admitting: *Deleted

## 2023-10-11 ENCOUNTER — Encounter: Payer: Self-pay | Admitting: Family Medicine

## 2023-11-22 NOTE — Progress Notes (Signed)
 [facility] CONSULT NOTE  Patient Care Team: Marvine Rush, MD as PCP - General (Family Medicine)  ASSESSMENT & PLAN:  Calvin Green is a 68 y.o. male with history of diabetes, fatty liver, GI ulcers being seen for iron  deficiency anemia.  Recent labs show iron  deficiency anemia.  Previous findings of gastric ulcer that was not bleeding actively.  Iron  saturation was 4%/22/25.  Hemoglobin was 7.8 and MCV was 73.  Iron  saturation was 7% in July and hemoglobin was 10 and low MCV again.  The mechanism of IDA is due to either blood loss or decreased absorptive mechanism or both.  He tolerated IV Venofer  in the past.  He has been taking over-the-counter iron  builder.  Will check levels today to see if any improvement.  If not we will order IV iron .    Assessment & Plan Iron  deficiency anemia due to chronic blood loss Will order IV iron  Last olonoscopy and EGD in 2025. CBC, Iron , TIBC, ferritin  Orders Placed This Encounter  Procedures   CBC with Differential (Cancer Center Only)    Standing Status:   Future    Number of Occurrences:   1    Expiration Date:   11/25/2024   Iron  and Iron  Binding Capacity ([facility] only)    Standing Status:   Future    Number of Occurrences:   1    Expiration Date:   11/25/2024   Ferritin    Standing Status:   Future    Number of Occurrences:   1    Expiration Date:   11/25/2024   All questions were answered. The patient knows to call the clinic with any problems, questions or concerns.  Pauletta JAYSON Chihuahua, MD 9/2/20254:58 PM   CHIEF COMPLAINTS/PURPOSE OF CONSULTATION:  Anemia  HISTORY OF PRESENTING ILLNESS:  Calvin Green 68 y.o. male is here because of anemia.  Send records show anemia and microcytosis, iron  deficiency.  Records from PCP reviewed.  Reports patient was admitted for melena and anemia with hemoglobin 5.7 in April of this year.  Finding including biopsy negative for H. pylori.  Medium size hiatal hernia.  Stomach ulcer biopsy showed  gastric antral mucosa with nonspecific reactive gastropathy.  Negative for H. pylori.  Negative for malignancy.  Colonoscopy showed hemorrhoids and diverticulosis, subcentimeter polyp removed.  10/24/2023 hemoglobin 10.2 MCV 69 RDW 23 platelet 223 WBC 5.2.  ferritin 5.8.  Iron  saturation 7%  Patient reports episodic dark black stool.   No ASA, NSAID or anticoagulation.   No bloody urine.   Wife reports he gets constipation from oral iron . He is taking a natural blood builder with iron  and b vitamins. He started taking 2 a day and repeat testing showed improvement. He had one iv iron  in April.  MEDICAL HISTORY:  Past Medical History:  Diagnosis Date   Anemia    Diabetes mellitus without complication (HCC)    Fatty liver    Gout    Hypertension    Obesity     SURGICAL HISTORY: Past Surgical History:  Procedure Laterality Date   COLONOSCOPY N/A 07/16/2023   Procedure: COLONOSCOPY;  Surgeon: Eartha Angelia Sieving, MD;  Location: [facility];  Service: Gastroenterology;  Laterality: N/A;   ENTEROSCOPY N/A 07/16/2023   Procedure: ENTEROSCOPY;  Surgeon: Eartha Angelia Sieving, MD;  Location: [facility];  Service: Gastroenterology;  Laterality: N/A;    SOCIAL HISTORY: Social History   Socioeconomic History   Marital status: Married    Spouse name: Not  on file   Number of children: Not on file   Years of education: Not on file   Highest education level: Not on file  Occupational History   Not on file  Tobacco Use   Smoking status: Never   Smokeless tobacco: Current    Types: Chew  Substance and Sexual Activity   Alcohol use: Yes    Comment: occas   Drug use: No   Sexual activity: Not on file  Other Topics Concern   Not on file  Social History Narrative   Not on file   Social Drivers of Health   Financial Resource Strain: Not on file  Food Insecurity: No Food Insecurity (11/26/2023)   Hunger Vital Sign    Worried About Running Out of Food in the Last Year:  Never true    Ran Out of Food in the Last Year: Never true  Transportation Needs: No Transportation Needs (11/26/2023)   PRAPARE - Administrator, Civil Service (Medical): No    Lack of Transportation (Non-Medical): No  Physical Activity: Not on file  Stress: Not on file  Social Connections: Unknown (07/14/2023)   Social Connection and Isolation Panel    Frequency of Communication with Friends and Family: More than three times a week    Frequency of Social Gatherings with Friends and Family: More than three times a week    Attends Religious Services: Patient unable to answer    Active Member of Clubs or Organizations: No    Attends Banker Meetings: 1 to 4 times per year    Marital Status: Married  Catering manager Violence: Not At Risk (11/26/2023)   Humiliation, Afraid, Rape, and Kick questionnaire    Fear of Current or Ex-Partner: No    Emotionally Abused: No    Physically Abused: No    Sexually Abused: No    FAMILY HISTORY: Family History  Problem Relation Age of Onset   Hypertension Other    Diabetes Other    Gout Other     ALLERGIES:  has no known allergies.  MEDICATIONS:  Current Outpatient Medications  Medication Sig Dispense Refill   acetaminophen  (TYLENOL ) 650 MG CR tablet Take 650 mg by mouth every 8 (eight) hours as needed (headache).     colchicine 0.6 MG tablet Take 2 tablets by mouth daily as needed (gout).     diltiazem  (CARDIZEM  LA) 360 MG 24 hr tablet Take 360 mg by mouth at bedtime.     FEROSUL 325 (65 Fe) MG tablet Take 325 mg by mouth 3 (three) times daily.     furosemide  (LASIX ) 20 MG tablet Take 1 tablet (20 mg total) by mouth daily as needed for fluid or edema (shortness of breath). 30 tablet 1   IRON  PO Take 2 tablets by mouth in the morning and at bedtime. [employer] Blood Builder     JANUMET 50-1000 MG tablet Take 1 tablet by mouth 2 (two) times daily.     levothyroxine  (SYNTHROID ) 50 MCG tablet Take 50 mcg by mouth daily.      olmesartan (BENICAR) 40 MG tablet Take 40 mg by mouth daily.     pantoprazole  (PROTONIX ) 40 MG tablet Take 1 tablet (40 mg total) by mouth 2 (two) times daily. 60 tablet 1   Probiotic Product (ALIGN PO) Take 1 capsule by mouth at bedtime.     No current facility-administered medications for this visit.    REVIEW OF SYSTEMS:   All relevant systems were reviewed  with the patient and are negative.  PHYSICAL EXAMINATION:  Vitals:   11/26/23 1238 11/26/23 1240  BP: (!) 174/97 (!) 173/98  Pulse: 65   Resp: 20   Temp: (!) 97.2 F (36.2 C)   SpO2: 98%    Filed Weights   11/26/23 1238  Weight: 191 lb 1.6 oz (86.7 kg)    GENERAL: alert, no distress and comfortable SKIN: skin color normal EYES: normal conjunctiva, sclera clear LUNGS: normal breathing effort HEART: regular rate & rhythm ABDOMEN: abdomen soft, non-tender and nondistended

## 2023-11-26 ENCOUNTER — Telehealth: Payer: Self-pay

## 2023-11-26 ENCOUNTER — Inpatient Hospital Stay

## 2023-11-26 VITALS — BP 173/98 | HR 65 | Temp 97.2°F | Resp 20 | Wt 191.1 lb

## 2023-11-26 DIAGNOSIS — F17220 Nicotine dependence, chewing tobacco, uncomplicated: Secondary | ICD-10-CM | POA: Insufficient documentation

## 2023-11-26 DIAGNOSIS — Z79899 Other long term (current) drug therapy: Secondary | ICD-10-CM | POA: Insufficient documentation

## 2023-11-26 DIAGNOSIS — K259 Gastric ulcer, unspecified as acute or chronic, without hemorrhage or perforation: Secondary | ICD-10-CM | POA: Diagnosis not present

## 2023-11-26 DIAGNOSIS — D509 Iron deficiency anemia, unspecified: Secondary | ICD-10-CM | POA: Insufficient documentation

## 2023-11-26 DIAGNOSIS — E119 Type 2 diabetes mellitus without complications: Secondary | ICD-10-CM | POA: Insufficient documentation

## 2023-11-26 DIAGNOSIS — D500 Iron deficiency anemia secondary to blood loss (chronic): Secondary | ICD-10-CM

## 2023-11-26 DIAGNOSIS — K760 Fatty (change of) liver, not elsewhere classified: Secondary | ICD-10-CM | POA: Diagnosis not present

## 2023-11-26 LAB — CBC WITH DIFFERENTIAL (CANCER CENTER ONLY)
Abs Immature Granulocytes: 0.02 K/uL (ref 0.00–0.07)
Basophils Absolute: 0.1 K/uL (ref 0.0–0.1)
Basophils Relative: 1 %
Eosinophils Absolute: 0.2 K/uL (ref 0.0–0.5)
Eosinophils Relative: 2 %
HCT: 38.1 % — ABNORMAL LOW (ref 39.0–52.0)
Hemoglobin: 11.7 g/dL — ABNORMAL LOW (ref 13.0–17.0)
Immature Granulocytes: 0 %
Lymphocytes Relative: 13 %
Lymphs Abs: 1 K/uL (ref 0.7–4.0)
MCH: 22.8 pg — ABNORMAL LOW (ref 26.0–34.0)
MCHC: 30.7 g/dL (ref 30.0–36.0)
MCV: 74.1 fL — ABNORMAL LOW (ref 80.0–100.0)
Monocytes Absolute: 0.5 K/uL (ref 0.1–1.0)
Monocytes Relative: 7 %
Neutro Abs: 6 K/uL (ref 1.7–7.7)
Neutrophils Relative %: 77 %
Platelet Count: 278 K/uL (ref 150–400)
RBC: 5.14 MIL/uL (ref 4.22–5.81)
RDW: 20 % — ABNORMAL HIGH (ref 11.5–15.5)
WBC Count: 7.9 K/uL (ref 4.0–10.5)
nRBC: 0 % (ref 0.0–0.2)

## 2023-11-26 LAB — IRON AND IRON BINDING CAPACITY (CC-WL,HP ONLY)
Iron: 30 ug/dL — ABNORMAL LOW (ref 45–182)
Saturation Ratios: 7 % — ABNORMAL LOW (ref 17.9–39.5)
TIBC: 449 ug/dL (ref 250–450)
UIBC: 419 ug/dL — ABNORMAL HIGH (ref 117–376)

## 2023-11-26 LAB — FERRITIN: Ferritin: 11 ng/mL — ABNORMAL LOW (ref 24–336)

## 2023-11-26 NOTE — Assessment & Plan Note (Addendum)
 Will order IV iron  Last olonoscopy and EGD in 2025. CBC, Iron , TIBC, ferritin

## 2023-11-26 NOTE — Telephone Encounter (Signed)
 Scheduled appointments, spoke with patient and he is aware.

## 2023-11-28 ENCOUNTER — Other Ambulatory Visit: Payer: Self-pay

## 2023-11-28 ENCOUNTER — Ambulatory Visit: Payer: Self-pay

## 2023-11-28 ENCOUNTER — Telehealth: Payer: Self-pay

## 2023-11-28 NOTE — Telephone Encounter (Signed)
-----   Message from Pauletta JAYSON Chihuahua sent at 11/28/2023  3:50 PM EDT ----- Please let him know good new his anemia improved quite a bit.  He is still in need more iron  so I have ordered another dose of IV iron .  Thank you.  ----- Message ----- From: Rebecka, [facility] [place] Sent: 11/26/2023   1:21 PM EDT To: Pauletta JAYSON Chihuahua, MD

## 2023-11-28 NOTE — Telephone Encounter (Signed)
 Pt advised with VU and is aware that after insurance approval scheduling will call him with his appt date and time

## 2023-11-28 NOTE — Telephone Encounter (Signed)
 Dr. Tina, patient will be scheduled as soon as possible.  Auth Submission: NO AUTH NEEDED Site of care: Site of care: [facility] Payer: Medicare A/B with AARP supplement Medication & CPT/J Code(s) submitted: Monoferric (Ferrci derisomaltose) [phone] Diagnosis Code:  Route of submission (phone, fax, portal):  Phone # Fax # Auth type: Buy/Bill PB Units/visits requested: 1000mg  x 1 dose Reference number:  Approval from: 11/28/23 to 01/28/24

## 2023-11-28 NOTE — Progress Notes (Signed)
 IV Monoferric ordered for iron  deficiency anemia.

## 2023-11-29 ENCOUNTER — Other Ambulatory Visit (HOSPITAL_COMMUNITY): Payer: Self-pay

## 2023-12-10 ENCOUNTER — Ambulatory Visit (HOSPITAL_COMMUNITY)
Admission: RE | Admit: 2023-12-10 | Discharge: 2023-12-10 | Disposition: A | Discharge status: Home / Self Care | Source: Ambulatory Visit

## 2023-12-10 VITALS — BP 133/74 | HR 60 | Temp 97.5°F | Resp 16

## 2023-12-10 DIAGNOSIS — D500 Iron deficiency anemia secondary to blood loss (chronic): Secondary | ICD-10-CM | POA: Diagnosis present

## 2023-12-10 MED ORDER — SODIUM CHLORIDE 0.9 % IV SOLN
1000.0000 mg | Freq: Once | INTRAVENOUS | Status: AC
  Administered 2023-12-10: 1000 mg via INTRAVENOUS
  Filled 2023-12-10: qty 10

## 2024-01-08 ENCOUNTER — Encounter (INDEPENDENT_AMBULATORY_CARE_PROVIDER_SITE_OTHER): Payer: Self-pay | Admitting: Gastroenterology

## 2024-03-30 DIAGNOSIS — D508 Other iron deficiency anemias: Secondary | ICD-10-CM

## 2024-03-31 ENCOUNTER — Inpatient Hospital Stay

## 2024-03-31 ENCOUNTER — Ambulatory Visit: Payer: Self-pay

## 2024-03-31 DIAGNOSIS — D508 Other iron deficiency anemias: Secondary | ICD-10-CM

## 2024-03-31 DIAGNOSIS — D500 Iron deficiency anemia secondary to blood loss (chronic): Secondary | ICD-10-CM | POA: Insufficient documentation

## 2024-03-31 LAB — CBC WITH DIFFERENTIAL (CANCER CENTER ONLY)
Abs Immature Granulocytes: 0.04 K/uL (ref 0.00–0.07)
Basophils Absolute: 0.1 K/uL (ref 0.0–0.1)
Basophils Relative: 1 %
Eosinophils Absolute: 0.3 K/uL (ref 0.0–0.5)
Eosinophils Relative: 4 %
HCT: 41.5 % (ref 39.0–52.0)
Hemoglobin: 13.5 g/dL (ref 13.0–17.0)
Immature Granulocytes: 1 %
Lymphocytes Relative: 16 %
Lymphs Abs: 1.2 K/uL (ref 0.7–4.0)
MCH: 27 pg (ref 26.0–34.0)
MCHC: 32.5 g/dL (ref 30.0–36.0)
MCV: 83 fL (ref 80.0–100.0)
Monocytes Absolute: 0.7 K/uL (ref 0.1–1.0)
Monocytes Relative: 9 %
Neutro Abs: 5.4 K/uL (ref 1.7–7.7)
Neutrophils Relative %: 69 %
Platelet Count: 328 K/uL (ref 150–400)
RBC: 5 MIL/uL (ref 4.22–5.81)
RDW: 15.1 % (ref 11.5–15.5)
WBC Count: 7.7 K/uL (ref 4.0–10.5)
nRBC: 0 % (ref 0.0–0.2)

## 2024-03-31 LAB — IRON AND IRON BINDING CAPACITY (CC-WL,HP ONLY)
Iron: 34 ug/dL — ABNORMAL LOW (ref 45–182)
Saturation Ratios: 12 % — ABNORMAL LOW (ref 17.9–39.5)
TIBC: 279 ug/dL (ref 250–450)
UIBC: 245 ug/dL

## 2024-03-31 LAB — FERRITIN: Ferritin: 150 ng/mL (ref 24–336)

## 2024-04-01 NOTE — Progress Notes (Signed)
 [name]  Patient Care Team: Marvine Rush, MD as PCP - General (Family Medicine)  Calvin Green is a 69 y.o. male with history of diabetes, fatty liver, GI ulcers, gastritis, hypertension, hypothyroidism being seen for iron  deficiency anemia.   Patient's previous labs showed iron  deficiency anemia.  Previous findings of gastric ulcer that was not bleeding actively.   Iron  saturation was 7 and ferritin was 11 on 9/2.  He had monoferric  on 9/16. Hgb and ferritin normalized. Improved microcytosis.  Hemoglobin is normal at 13.5. Assessment & Plan Iron  deficiency anemia due to chronic blood loss Okay to continue oral supplements with iron  daily CBC, ferritin and iron  panel in about 6 months  Orders Placed This Encounter  Procedures   CBC with Differential (Cancer Center Only)    Standing Status:   Future    Expiration Date:   04/02/2025   Iron  and Iron  Binding Capacity ([facility] only)    Standing Status:   Future    Expiration Date:   04/02/2025   Ferritin    Standing Status:   Future    Expiration Date:   04/02/2025     Calvin JAYSON Chihuahua, MD  INTERVAL HISTORY: Patient returns for follow-up.  He denies any bloody stool, dark stool, nausea, vomiting, stomach pain.  No GI bleeding or other bleeding.  Otherwise feeling well.  Hematology Hx: Patient was admitted for melena and anemia with hemoglobin 5.7 in April of 2025. Patient reports episodic dark black stool.  Finding including biopsy negative for H. pylori.  Medium size hiatal hernia.  Findings of Three non- bleeding cratered gastric ulcers with a clean ulcer base ( Forrest Class III) were found in the gastric antrum. The largest lesion was 8 mm in largest dimension. Negative for malignancy.   Colonoscopy showed hemorrhoids and diverticulosis, subcentimeter polyp removed.   10/24/2023 hemoglobin 10.2 MCV 69 RDW 23 platelet 223 WBC 5.2.  ferritin 5.8.  Iron  saturation 7% 11/2023 hemoglobin 11.7 MCV 74 ferritin  11 He had monoferric  on 9/16. 03/31/2024 hemoglobin 13.5 MCV 83 ferritin 150   PHYSICAL EXAMINATION:  Vitals:   04/02/24 1340 04/02/24 1341  BP: (!) 171/107 (!) 155/100  Pulse: 88   Resp: 17   Temp: (!) 97.3 F (36.3 C)   SpO2: 100%    Filed Weights   04/02/24 1340  Weight: 191 lb (86.6 kg)    GENERAL: alert, no distress and comfortable SKIN: skin color normal and not pale   Relevant data reviewed during this visit included labs.  New labs ordered.

## 2024-04-02 ENCOUNTER — Inpatient Hospital Stay (HOSPITAL_BASED_OUTPATIENT_CLINIC_OR_DEPARTMENT_OTHER)

## 2024-04-02 VITALS — BP 155/100 | HR 88 | Temp 97.3°F | Resp 17 | Ht 72.0 in | Wt 191.0 lb

## 2024-04-02 DIAGNOSIS — D500 Iron deficiency anemia secondary to blood loss (chronic): Secondary | ICD-10-CM | POA: Diagnosis not present

## 2024-04-02 NOTE — Assessment & Plan Note (Signed)
 Okay to continue oral supplements with iron  daily CBC, ferritin and iron  panel in about 6 months

## 2024-10-01 ENCOUNTER — Inpatient Hospital Stay

## 2024-10-12 ENCOUNTER — Inpatient Hospital Stay
